# Patient Record
Sex: Female | Born: 1995 | Hispanic: No | Marital: Single | Smoking: Never smoker
Health system: Southern US, Community
[De-identification: ages and names within clinical notes are randomized; demographics above are authoritative.]

## PROBLEM LIST (undated history)

## (undated) ENCOUNTER — Inpatient Hospital Stay (HOSPITAL_COMMUNITY): Payer: Self-pay

## (undated) DIAGNOSIS — M25369 Other instability, unspecified knee: Secondary | ICD-10-CM

## (undated) DIAGNOSIS — I10 Essential (primary) hypertension: Secondary | ICD-10-CM

## (undated) HISTORY — PX: OTHER SURGICAL HISTORY: SHX169

---
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
## 2015-03-16 ENCOUNTER — Emergency Department (HOSPITAL_COMMUNITY)
Admission: EM | Admit: 2015-03-16 | Discharge: 2015-03-16 | Disposition: A | Discharge status: Home / Self Care | Payer: Medicaid Other | Attending: Emergency Medicine | Admitting: Emergency Medicine

## 2015-03-16 ENCOUNTER — Encounter (HOSPITAL_COMMUNITY): Payer: Self-pay | Admitting: Emergency Medicine

## 2015-03-16 DIAGNOSIS — R197 Diarrhea, unspecified: Secondary | ICD-10-CM | POA: Insufficient documentation

## 2015-03-16 DIAGNOSIS — R1084 Generalized abdominal pain: Secondary | ICD-10-CM | POA: Insufficient documentation

## 2015-03-16 DIAGNOSIS — Z3202 Encounter for pregnancy test, result negative: Secondary | ICD-10-CM | POA: Insufficient documentation

## 2015-03-16 DIAGNOSIS — J111 Influenza due to unidentified influenza virus with other respiratory manifestations: Secondary | ICD-10-CM | POA: Insufficient documentation

## 2015-03-16 DIAGNOSIS — I10 Essential (primary) hypertension: Secondary | ICD-10-CM | POA: Diagnosis not present

## 2015-03-16 DIAGNOSIS — R000 Tachycardia, unspecified: Secondary | ICD-10-CM | POA: Diagnosis not present

## 2015-03-16 DIAGNOSIS — R112 Nausea with vomiting, unspecified: Secondary | ICD-10-CM | POA: Diagnosis present

## 2015-03-16 DIAGNOSIS — J45909 Unspecified asthma, uncomplicated: Secondary | ICD-10-CM | POA: Insufficient documentation

## 2015-03-16 DIAGNOSIS — Z79899 Other long term (current) drug therapy: Secondary | ICD-10-CM | POA: Diagnosis not present

## 2015-03-16 LAB — COMPREHENSIVE METABOLIC PANEL
ALK PHOS: 82 U/L (ref 38–126)
ALT: 13 U/L — ABNORMAL LOW (ref 14–54)
ANION GAP: 12 (ref 5–15)
AST: 17 U/L (ref 15–41)
Albumin: 4.6 g/dL (ref 3.5–5.0)
BUN: 13 mg/dL (ref 6–20)
CALCIUM: 10 mg/dL (ref 8.9–10.3)
CO2: 19 mmol/L — AB (ref 22–32)
Chloride: 107 mmol/L (ref 101–111)
Creatinine, Ser: 0.87 mg/dL (ref 0.44–1.00)
GFR calc Af Amer: >60 mL/min (ref >60)
GFR calc non Af Amer: >60 mL/min (ref >60)
Glucose, Bld: 128 mg/dL — ABNORMAL HIGH (ref 65–99)
Potassium: 4.2 mmol/L (ref 3.5–5.1)
SODIUM: 138 mmol/L (ref 135–145)
Total Bilirubin: 0.7 mg/dL (ref 0.3–1.2)
Total Protein: 7.3 g/dL (ref 6.5–8.1)

## 2015-03-16 LAB — CBC
HCT: 40.6 % (ref 36.0–46.0)
Hemoglobin: 13.5 g/dL (ref 12.0–15.0)
MCH: 28 pg (ref 26.0–34.0)
MCHC: 33.3 g/dL (ref 30.0–36.0)
MCV: 84.2 fL (ref 78.0–100.0)
PLATELETS: 270 10*3/uL (ref 150–400)
RBC: 4.82 MIL/uL (ref 3.87–5.11)
RDW: 12.8 % (ref 11.5–15.5)
WBC: 14.6 10*3/uL — ABNORMAL HIGH (ref 4.0–10.5)

## 2015-03-16 LAB — LIPASE, BLOOD: Lipase: 25 U/L (ref 11–51)

## 2015-03-16 LAB — URINE MICROSCOPIC-ADD ON

## 2015-03-16 LAB — URINALYSIS, ROUTINE W REFLEX MICROSCOPIC
Bilirubin Urine: NEGATIVE
Glucose, UA: NEGATIVE mg/dL
HGB URINE DIPSTICK: NEGATIVE
Ketones, ur: 40 mg/dL — AB
Leukocytes, UA: NEGATIVE
NITRITE: NEGATIVE
PROTEIN: 30 mg/dL — AB
SPECIFIC GRAVITY, URINE: 1.024 (ref 1.005–1.030)
pH: 8.5 — ABNORMAL HIGH (ref 5.0–8.0)

## 2015-03-16 LAB — POC URINE PREG, ED: PREG TEST UR: NEGATIVE

## 2015-03-16 MED ORDER — MORPHINE SULFATE (PF) 2 MG/ML IV SOLN
2.0000 mg | Freq: Once | INTRAVENOUS | Status: AC
  Administered 2015-03-16: 2 mg via INTRAVENOUS
  Filled 2015-03-16: qty 1

## 2015-03-16 MED ORDER — ONDANSETRON 4 MG PO TBDP
ORAL_TABLET | ORAL | Status: AC
  Filled 2015-03-16: qty 1

## 2015-03-16 MED ORDER — SODIUM CHLORIDE 0.9 % IV BOLUS (SEPSIS)
1000.0000 mL | Freq: Once | INTRAVENOUS | Status: AC
  Administered 2015-03-16: 1000 mL via INTRAVENOUS

## 2015-03-16 MED ORDER — ONDANSETRON HCL 4 MG PO TABS: 4.0000 mg | ORAL_TABLET | Freq: Four times a day (QID) | ORAL | Status: DC

## 2015-03-16 MED ORDER — ONDANSETRON HCL 4 MG/2ML IJ SOLN
4.0000 mg | Freq: Once | INTRAMUSCULAR | Status: AC
  Administered 2015-03-16: 4 mg via INTRAVENOUS
  Filled 2015-03-16: qty 2

## 2015-03-16 NOTE — ED Notes (Signed)
Per ems-- pt has had cough x 4 days and tonight started having abdominal pain with nvd.

## 2015-03-16 NOTE — ED Notes (Signed)
 4MG  ZOFRAN ADMINISTERED IN TRIAGE

## 2015-03-16 NOTE — Discharge Instructions (Signed)
Diarrhea °Diarrhea is frequent loose and watery bowel movements. It can cause you to feel weak and dehydrated. Dehydration can cause you to become tired and thirsty, have a dry mouth, and have decreased urination that often is dark yellow. Diarrhea is a sign of another problem, most often an infection that will not last long. In most cases, diarrhea typically lasts 2-3 days. However, it can last longer if it is a sign of something more serious. It is important to treat your diarrhea as directed by your caregiver to lessen or prevent future episodes of diarrhea. °CAUSES  °Some common causes include: °· Gastrointestinal infections caused by viruses, bacteria, or parasites. °· Food poisoning or food allergies. °· Certain medicines, such as antibiotics, chemotherapy, and laxatives. °· Artificial sweeteners and fructose. °· Digestive disorders. °HOME CARE INSTRUCTIONS °· Ensure adequate fluid intake (hydration): Have 1 cup (8 oz) of fluid for each diarrhea episode. Avoid fluids that contain simple sugars or sports drinks, fruit juices, whole milk products, and sodas. Your urine should be clear or pale yellow if you are drinking enough fluids. Hydrate with an oral rehydration solution that you can purchase at pharmacies, retail stores, and online. You can prepare an oral rehydration solution at home by mixing the following ingredients together: °·  - tsp table salt. °· ¾ tsp baking soda. °·  tsp salt substitute containing potassium chloride. °· 1  tablespoons sugar. °· 1 L (34 oz) of water. °· Certain foods and beverages may increase the speed at which food moves through the gastrointestinal (GI) tract. These foods and beverages should be avoided and include: °· Caffeinated and alcoholic beverages. °· High-fiber foods, such as raw fruits and vegetables, nuts, seeds, and whole grain breads and cereals. °· Foods and beverages sweetened with sugar alcohols, such as xylitol, sorbitol, and mannitol. °· Some foods may be well  tolerated and may help thicken stool including: °· Starchy foods, such as rice, toast, pasta, low-sugar cereal, oatmeal, grits, baked potatoes, crackers, and bagels. °· Bananas. °· Applesauce. °· Add probiotic-rich foods to help increase healthy bacteria in the GI tract, such as yogurt and fermented milk products. °· Wash your hands well after each diarrhea episode. °· Only take over-the-counter or prescription medicines as directed by your caregiver. °· Take a warm bath to relieve any burning or pain from frequent diarrhea episodes. °SEEK IMMEDIATE MEDICAL CARE IF:  °· You are unable to keep fluids down. °· You have persistent vomiting. °· You have blood in your stool, or your stools are black and tarry. °· You do not urinate in 6-8 hours, or there is only a small amount of very dark urine. °· You have abdominal pain that increases or localizes. °· You have weakness, dizziness, confusion, or light-headedness. °· You have a severe headache. °· Your diarrhea gets worse or does not get better. °· You have a fever or persistent symptoms for more than 2-3 days. °· You have a fever and your symptoms suddenly get worse. °MAKE SURE YOU:  °· Understand these instructions. °· Will watch your condition. °· Will get help right away if you are not doing well or get worse. °  °This information is not intended to replace advice given to you by your health care provider. Make sure you discuss any questions you have with your health care provider. °  °Document Released: 12/13/2001 Document Revised: 01/13/2014 Document Reviewed: 08/31/2011 °Elsevier Interactive Patient Education ©2016 Elsevier Inc. ° °Nausea and Vomiting °Nausea is a sick feeling that often comes   before throwing up (vomiting). Vomiting is a reflex where stomach contents come out of your mouth. Vomiting can cause severe loss of body fluids (dehydration). Children and elderly adults can become dehydrated quickly, especially if they also have diarrhea. Nausea and  vomiting are symptoms of a condition or disease. It is important to find the cause of your symptoms. CAUSES   Direct irritation of the stomach lining. This irritation can result from increased acid production (gastroesophageal reflux disease), infection, food poisoning, taking certain medicines (such as nonsteroidal anti-inflammatory drugs), alcohol use, or tobacco use.  Signals from the brain.These signals could be caused by a headache, heat exposure, an inner ear disturbance, increased pressure in the brain from injury, infection, a tumor, or a concussion, pain, emotional stimulus, or metabolic problems.  An obstruction in the gastrointestinal tract (bowel obstruction).  Illnesses such as diabetes, hepatitis, gallbladder problems, appendicitis, kidney problems, cancer, sepsis, atypical symptoms of a heart attack, or eating disorders.  Medical treatments such as chemotherapy and radiation.  Receiving medicine that makes you sleep (general anesthetic) during surgery. DIAGNOSIS Your caregiver may ask for tests to be done if the problems do not improve after a few days. Tests may also be done if symptoms are severe or if the reason for the nausea and vomiting is not clear. Tests may include:  Urine tests.  Blood tests.  Stool tests.  Cultures (to look for evidence of infection).  X-rays or other imaging studies. Test results can help your caregiver make decisions about treatment or the need for additional tests. TREATMENT You need to stay well hydrated. Drink frequently but in small amounts.You may wish to drink water, sports drinks, clear broth, or eat frozen ice pops or gelatin dessert to help stay hydrated.When you eat, eating slowly may help prevent nausea.There are also some antinausea medicines that may help prevent nausea. HOME CARE INSTRUCTIONS   Take all medicine as directed by your caregiver.  If you do not have an appetite, do not force yourself to eat. However, you must  continue to drink fluids.  If you have an appetite, eat a normal diet unless your caregiver tells you differently.  Eat a variety of complex carbohydrates (rice, wheat, potatoes, bread), lean meats, yogurt, fruits, and vegetables.  Avoid high-fat foods because they are more difficult to digest.  Drink enough water and fluids to keep your urine clear or pale yellow.  If you are dehydrated, ask your caregiver for specific rehydration instructions. Signs of dehydration may include:  Severe thirst.  Dry lips and mouth.  Dizziness.  Dark urine.  Decreasing urine frequency and amount.  Confusion.  Rapid breathing or pulse. SEEK IMMEDIATE MEDICAL CARE IF:   You have blood or brown flecks (like coffee grounds) in your vomit.  You have black or bloody stools.  You have a severe headache or stiff neck.  You are confused.  You have severe abdominal pain.  You have chest pain or trouble breathing.  You do not urinate at least once every 8 hours.  You develop cold or clammy skin.  You continue to vomit for longer than 24 to 48 hours.  You have a fever. MAKE SURE YOU:   Understand these instructions.  Will watch your condition.  Will get help right away if you are not doing well or get worse.   This information is not intended to replace advice given to you by your health care provider. Make sure you discuss any questions you have with your health care  provider.   Document Released: 12/23/2004 Document Revised: 03/17/2011 Document Reviewed: 05/22/2010 Elsevier Interactive Patient Education 2016 Elsevier Inc.  Influenza, Adult Influenza ("the flu") is a viral infection of the respiratory tract. It occurs more often in winter months because people spend more time in close contact with one another. Influenza can make you feel very sick. Influenza easily spreads from person to person (contagious). CAUSES  Influenza is caused by a virus that infects the respiratory tract.  You can catch the virus by breathing in droplets from an infected person's cough or sneeze. You can also catch the virus by touching something that was recently contaminated with the virus and then touching your mouth, nose, or eyes. RISKS AND COMPLICATIONS You may be at risk for a more severe case of influenza if you smoke cigarettes, have diabetes, have chronic heart disease (such as heart failure) or lung disease (such as asthma), or if you have a weakened immune system. Elderly people and pregnant women are also at risk for more serious infections. The most common problem of influenza is a lung infection (pneumonia). Sometimes, this problem can require emergency medical care and may be life threatening. SIGNS AND SYMPTOMS  Symptoms typically last 4 to 10 days and may include:  Fever.  Chills.  Headache, body aches, and muscle aches.  Sore throat.  Chest discomfort and cough.  Poor appetite.  Weakness or feeling tired.  Dizziness.  Nausea or vomiting. DIAGNOSIS  Diagnosis of influenza is often made based on your history and a physical exam. A nose or throat swab test can be done to confirm the diagnosis. TREATMENT  In mild cases, influenza goes away on its own. Treatment is directed at relieving symptoms. For more severe cases, your health care provider may prescribe antiviral medicines to shorten the sickness. Antibiotic medicines are not effective because the infection is caused by a virus, not by bacteria. HOME CARE INSTRUCTIONS  Take medicines only as directed by your health care provider.  Use a cool mist humidifier to make breathing easier.  Get plenty of rest until your temperature returns to normal. This usually takes 3 to 4 days.  Drink enough fluid to keep your urine clear or pale yellow.  Cover yourmouth and nosewhen coughing or sneezing,and wash your handswellto prevent thevirusfrom spreading.  Stay homefromwork orschool untilthe fever is gonefor at  least 771full day. PREVENTION  An annual influenza vaccination (flu shot) is the best way to avoid getting influenza. An annual flu shot is now routinely recommended for all adults in the [place] SEEK MEDICAL CARE IF:  You experiencechest pain, yourcough worsens,or you producemore mucus.  Youhave nausea,vomiting, ordiarrhea.  Your fever returns or gets worse. SEEK IMMEDIATE MEDICAL CARE IF:  You havetrouble breathing, you become short of breath,or your skin ornails becomebluish.  You have severe painor stiffnessin the neck.  You develop a sudden headache, or pain in the face or ear.  You have nausea or vomiting that you cannot control. MAKE SURE YOU:   Understand these instructions.  Will watch your condition.  Will get help right away if you are not doing well or get worse.   This information is not intended to replace advice given to you by your health care provider. Make sure you discuss any questions you have with your health care provider.   Document Released: 12/21/1999 Document Revised: 01/13/2014 Document Reviewed: 03/24/2011 Elsevier Interactive Patient Education [employer]2016 Else[employer] Inc.

## 2015-03-16 NOTE — ED Provider Notes (Signed)
CSN: [id number]     Arrival date & time 03/16/15  0003 History  By signing my name below, I, Bethel Born, attest that this documentation has been prepared under the direction and in the presence of Gilda Crease, MD. Electronically Signed: Bethel Born, ED Scribe. 03/16/2015. 4:01 AM   Chief Complaint  Patient presents with  . Abdominal Pain    The history is provided by the patient. No language interpreter was used.   Brought in by EMS, Norma Morris is a 20 y.o. female who presents to the Emergency Department complaining of a persistent, moderate,  dry cough with onset 4 days ago. Associated symptoms include N/V/D since yesterday afternoon, cramping abdominal pain, and back pain. Pt denies fever.   Past Medical History  Diagnosis Date  . Knee gives out   . Asthma   . Hypertension     no medication currently   History reviewed. No pertinent past surgical history. Family History  Problem Relation Age of Onset  . Asthma Mother    Social History  Substance Use Topics  . Smoking status: Never Smoker   . Smokeless tobacco: Never Used  . Alcohol Use: No   OB History    No data available     Review of Systems  Constitutional: Negative for fever.  Respiratory: Positive for cough.   Gastrointestinal: Positive for nausea, vomiting, abdominal pain and diarrhea.  Musculoskeletal: Positive for back pain.  All other systems reviewed and are negative.     Allergies  Review of patient's allergies indicates no known allergies.  Home Medications   Prior to Admission medications   Medication Sig Start Date End Date Taking? Authorizing Provider  albuterol (PROVENTIL HFA;VENTOLIN HFA) 108 (90 BASE) MCG/ACT inhaler Inhale 2 puffs into the lungs every 4 (four) hours as needed for wheezing or shortness of breath.     Historical Provider, MD  budesonide-formoterol (SYMBICORT) 160-4.5 MCG/ACT inhaler Inhale 2 puffs into the lungs 2 (two) times daily.    Historical  Provider, MD  Cholecalciferol (VITAMIN D3) 2000 UNITS TABS Take 1 tablet by mouth daily.    Historical Provider, MD  ibuprofen (ADVIL,MOTRIN) 200 MG tablet Take 600 mg by mouth every 6 (six) hours as needed for pain.    Historical Provider, MD   BP 144/91 mmHg  Pulse 128  Temp(Src) 98.3 F (36.8 C) (Oral)  Resp 16  Ht 5' (1.524 m)  Wt 142 lb (64.411 kg)  BMI 27.73 kg/m2  SpO2 98%  LMP 02/28/2015 Physical Exam  Constitutional: She is oriented to person, place, and time. She appears well-developed and well-nourished. No distress.  HENT:  Head: Normocephalic and atraumatic.  Right Ear: Hearing normal.  Left Ear: Hearing normal.  Nose: Nose normal.  Mouth/Throat: Oropharynx is clear and moist. Mucous membranes are dry.  Dry mucous membranes  Eyes: Conjunctivae and EOM are normal. Pupils are equal, round, and reactive to light.  Neck: Normal range of motion. Neck supple.  Cardiovascular: Regular rhythm, S1 normal and S2 normal.  Tachycardia present.  Exam reveals no gallop and no friction rub.   No murmur heard. Pulmonary/Chest: Effort normal and breath sounds normal. No respiratory distress. She exhibits no tenderness.  Abdominal: Soft. Normal appearance and bowel sounds are normal. There is no hepatosplenomegaly. There is tenderness. There is no rebound, no guarding, no tenderness at McBurney's point and negative Murphy's sign. No hernia.  Diffuse abdominal tenderness with no guarding or rebound  Musculoskeletal: Normal range of motion.  Neurological: She  is alert and oriented to person, place, and time. She has normal strength. No cranial nerve deficit or sensory deficit. Coordination normal. GCS eye subscore is 4. GCS verbal subscore is 5. GCS motor subscore is 6.  Skin: Skin is warm, dry and intact. No rash noted. No cyanosis.  Psychiatric: She has a normal mood and affect. Her speech is normal and behavior is normal. Thought content normal.  Nursing note and vitals  reviewed.   ED Course  Procedures (including critical care time) DIAGNOSTIC STUDIES: Oxygen Saturation is 98% on RA,  normal by my interpretation.    COORDINATION OF CARE: 4:00 AM Discussed treatment plan which includes lab work, morphine, Zofran, and IVF with pt at bedside and pt agreed to plan.  Labs Review Labs Reviewed  COMPREHENSIVE METABOLIC PANEL - Abnormal; Notable for the following:    CO2 19 (*)    Glucose, Bld 128 (*)    ALT 13 (*)    All other components within normal limits  CBC - Abnormal; Notable for the following:    WBC 14.6 (*)    All other components within normal limits  URINALYSIS, ROUTINE W REFLEX MICROSCOPIC (NOT [facility]) - Abnormal; Notable for the following:    pH 8.5 (*)    Ketones, ur 40 (*)    Protein, ur 30 (*)    All other components within normal limits  URINE MICROSCOPIC-ADD ON - Abnormal; Notable for the following:    Squamous Epithelial / LPF 0-5 (*)    Bacteria, UA RARE (*)    All other components within normal limits  LIPASE, BLOOD  POC URINE PREG, ED    Imaging Review No results found. I have personally reviewed and evaluated these lab results as part of my medical decision-making.   EKG Interpretation None      MDM   Final diagnoses:  None  Vomiting Diarrhea Flu  Patient presents to the emergency part for evaluation of flulike illness. Patient reports that she has had cough and chest congestion for 4 days. Tonight she started having acute onset of abdominal pain with nausea, vomiting and diarrhea. Examination revealed mild diffuse tenderness without guarding or rebound. No concern for acute surgical process. Symptoms have improved with treatment. Lab work is unremarkable. Patient will continue outpatient symptomatic treatment  I personally performed the services described in this documentation, which was scribed in my presence. The recorded information has been reviewed and is accurate.    Gilda Creasehristopher J Pollina,  MD 03/16/15 0630

## 2015-06-16 ENCOUNTER — Encounter (HOSPITAL_COMMUNITY): Payer: Self-pay | Admitting: *Deleted

## 2015-06-16 ENCOUNTER — Ambulatory Visit (HOSPITAL_COMMUNITY)
Admission: EM | Admit: 2015-06-16 | Discharge: 2015-06-16 | Disposition: A | Discharge status: Home / Self Care | Payer: Medicaid Other | Attending: Emergency Medicine | Admitting: Emergency Medicine

## 2015-06-16 DIAGNOSIS — M62830 Muscle spasm of back: Secondary | ICD-10-CM | POA: Diagnosis not present

## 2015-06-16 MED ORDER — KETOROLAC TROMETHAMINE 30 MG/ML IJ SOLN
INTRAMUSCULAR | Status: AC
  Filled 2015-06-16: qty 1

## 2015-06-16 MED ORDER — METHYLPREDNISOLONE ACETATE 40 MG/ML IJ SUSP
INTRAMUSCULAR | Status: AC
  Filled 2015-06-16: qty 1

## 2015-06-16 MED ORDER — CYCLOBENZAPRINE HCL 10 MG PO TABS: 10.0000 mg | ORAL_TABLET | Freq: Two times a day (BID) | ORAL | Status: DC | PRN

## 2015-06-16 MED ORDER — KETOROLAC TROMETHAMINE 30 MG/ML IJ SOLN
30.0000 mg | Freq: Once | INTRAMUSCULAR | Status: AC
  Administered 2015-06-16: 30 mg via INTRAMUSCULAR

## 2015-06-16 MED ORDER — NAPROXEN SODIUM 550 MG PO TABS: 550.0000 mg | ORAL_TABLET | Freq: Two times a day (BID) | ORAL | Status: DC

## 2015-06-16 MED ORDER — METHYLPREDNISOLONE ACETATE 40 MG/ML IJ SUSP
40.0000 mg | Freq: Once | INTRAMUSCULAR | Status: AC
  Administered 2015-06-16: 40 mg via INTRAMUSCULAR

## 2015-06-16 NOTE — ED Provider Notes (Signed)
CSN: [id number]     Arrival date & time 06/16/15  1817 History   First MD Initiated Contact with Patient 06/16/15 1836     Chief Complaint  Patient presents with  . 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 Knee gives out   . Asthma   . 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 No pertinent past surgical history. Family History  Problem Relation Age of Onset  . Asthma Mother    Social History  Substance Use Topics  . Smoking status: Never Smoker   . Smokeless tobacco: Never Used  . Alcohol Use: No   OB History    No data available     Review of Systems  Denies: HEADACHE, NAUSEA, ABDOMINAL PAIN, CHEST PAIN, CONGESTION, DYSURIA, SHORTNESS OF BREATH  Allergies  Review of patient's allergies indicates no known allergies.  Home Medications   Prior to Admission medications   Medication Sig Start Date End Date Taking? Authorizing Provider  albuterol (PROVENTIL HFA;VENTOLIN HFA) 108 (90 BASE) MCG/ACT inhaler Inhale 2 puffs into the lungs every 4 (four) hours as needed for wheezing or shortness of breath.    Yes Historical Provider, MD  budesonide-formoterol (SYMBICORT) 160-4.5 MCG/ACT inhaler Inhale 2 puffs into the lungs 2 (two) times daily.   Yes Historical Provider, MD  cetirizine (ZYRTEC) 10 MG tablet Take 10 mg by mouth daily.   Yes Historical Provider, MD  Cholecalciferol (VITAMIN D3) 2000 UNITS TABS Take 1 tablet by mouth daily.    Historical Provider, MD  cyclobenzaprine (FLEXERIL) 10 MG tablet  Take 1 tablet (10 mg total) by mouth 2 (two) times daily as needed for muscle spasms. 06/16/15   Tharon Aquas, PA  naproxen sodium (ANAPROX DS) 550 MG tablet Take 1 tablet (550 mg total) by mouth 2 (two) times daily with a meal. 06/16/15   Tharon Aquas, PA  ondansetron (ZOFRAN) 4 MG tablet Take 1 tablet (4 mg total) by mouth every 6 (six) hours. 03/16/15   Gilda Crease, MD   Meds Ordered and Administered this Visit   Medications  ketorolac (TORADOL) 30 MG/ML injection 30 mg (not administered)  methylPREDNISolone acetate (DEPO-MEDROL) injection 40 mg (not administered)    BP 125/80 mmHg  Pulse 88  Temp(Src) 98.3 F (36.8 C) (Oral)  Resp 16  SpO2 100%  LMP 05/28/2015 (Exact Date) No data found.   Physical Exam NURSES NOTES AND VITAL SIGNS REVIEWED. CONSTITUTIONAL: Well developed, well nourished, no acute distress HEENT: normocephalic, atraumatic EYES: Conjunctiva normal NECK:normal ROM, supple, no adenopathy PULMONARY:No respiratory distress, normal effort ABDOMINAL: Soft, ND, NT BS+, No CVAT MUSCULOSKELETAL: Normal ROM of all extremities,palpable spasm noted left lumbar,SLR's neg  SKIN: warm and dry without rash PSYCHIATRIC: Mood and affect, behavior are normal  ED Course  Procedures (including critical care time)  Labs Review Labs Reviewed - No data to display  Imaging Review No results found.   Visual Acuity Review  Right Eye Distance:   Left Eye Distance:   Bilateral Distance:    Right Eye Near:   Left Eye Near:  Bilateral Near:       Flexeril, anaprox  Expect fully recovery>  MDM   1. Spasm of back muscles     Patient is reassured that there are no issues that require transfer to higher level of care at this time or additional tests. Patient is advised to continue home symptomatic treatment. Patient is advised that if there are new or worsening symptoms to attend the emergency department, contact primary care provider, or return to  [facility]. Instructions of care provided discharged home in stable condition.    THIS NOTE WAS GENERATED USING A VOICE RECOGNITION SOFTWARE PROGRAM. ALL REASONABLE EFFORTS  WERE MADE TO PROOFREAD THIS DOCUMENT FOR ACCURACY.  I have verbally reviewed the discharge instructions with the patient. A printed AVS was given to the patient.  All questions were answered prior to discharge.      Tharon AquasFrank C Tamyra Fojtik, PA 06/16/15 [phone]

## 2015-06-16 NOTE — Discharge Instructions (Signed)
Heat Therapy Heat therapy can help ease sore, stiff, injured, and tight muscles and joints. Heat relaxes your muscles, which may help ease your pain.  RISKS AND COMPLICATIONS If you have any of the following conditions, do not use heat therapy unless your health care provider has approved: 1. Poor circulation. 2. Healing wounds or scarred skin in the area being treated. 3. Diabetes, heart disease, or high blood pressure. 4. Not being able to feel (numbness) the area being treated. 5. Unusual swelling of the area being treated. 6. Active infections. 7. Blood clots. 8. Cancer. 9. Inability to communicate pain. This may include young children and people who have problems with their brain function (dementia). 10. Pregnancy. Heat therapy should only be used on old, pre-existing, or long-lasting (chronic) injuries. Do not use heat therapy on new injuries unless directed by your health care provider. HOW TO USE HEAT THERAPY There are several different kinds of heat therapy, including: 1. Moist heat pack. 2. Warm water bath. 3. Hot water bottle. 4. Electric heating pad. 5. Heated gel pack. 6. Heated wrap. 7. Electric heating pad. Use the heat therapy method suggested by your health care provider. Follow your health care provider's instructions on when and how to use heat therapy. GENERAL HEAT THERAPY RECOMMENDATIONS 1. Do not sleep while using heat therapy. Only use heat therapy while you are awake. 2. Your skin may turn pink while using heat therapy. Do not use heat therapy if your skin turns red. 3. Do not use heat therapy if you have new pain. 4. High heat or long exposure to heat can cause burns. Be careful when using heat therapy to avoid burning your skin. 5. Do not use heat therapy on areas of your skin that are already irritated, such as with a rash or sunburn. SEEK MEDICAL CARE IF: 1. You have blisters, redness, swelling, or numbness. 2. You have new pain. 3. Your pain is  worse. MAKE SURE YOU: 1. Understand these instructions. 2. Will watch your condition. 3. Will get help right away if you are not doing well or get worse.   This information is not intended to replace advice given to you by your health care provider. Make sure you discuss any questions you have with your health care provider.   Document Released: 03/17/2011 Document Revised: 01/13/2014 Document Reviewed: 02/15/2013 Elsevier Interactive Patient Education 2016 [employer]  Back Exercises If you have pain in your back, do these exercises 2-3 times each day or as told by your doctor. When the pain goes away, do the exercises once each day, but repeat the steps more times for each exercise (do more repetitions). If you do not have pain in your back, do these exercises once each day or as told by your doctor. EXERCISES Single Knee to Chest Do these steps 3-5 times in a row for each leg: 11. Lie on your back on a firm bed or the floor with your legs stretched out. 12. Bring one knee to your chest. 13. Hold your knee to your chest by grabbing your knee or thigh. 14. Pull on your knee until you feel a gentle stretch in your lower back. 15. Keep doing the stretch for 10-30 seconds. 16. Slowly let go of your leg and straighten it. Pelvic Tilt Do these steps 5-10 times in a row: 8. Lie on your back on a firm bed or the floor with your legs stretched out. 9. Bend your knees so they point up to the ceiling. Your  feet should be flat on the floor. 10. Tighten your lower belly (abdomen) muscles to press your lower back against the floor. This will make your tailbone point up to the ceiling instead of pointing down to your feet or the floor. 11. Stay in this position for 5-10 seconds while you gently tighten your muscles and breathe evenly. Cat-Cow Do these steps until your lower back bends more easily: 6. Get on your hands and knees on a firm surface. Keep your hands under your shoulders, and keep  your knees under your hips. You may put padding under your knees. 7. Let your head hang down, and make your tailbone point down to the floor so your lower back is round like the back of a cat. 8. Stay in this position for 5 seconds. 9. Slowly lift your head and make your tailbone point up to the ceiling so your back hangs low (sags) like the back of a cow. 10. Stay in this position for 5 seconds. Press-Ups Do these steps 5-10 times in a row: 4. Lie on your belly (face-down) on the floor. 5. Place your hands near your head, about shoulder-width apart. 6. While you keep your back relaxed and keep your hips on the floor, slowly straighten your arms to raise the top half of your body and lift your shoulders. Do not use your back muscles. To make yourself more comfortable, you may change where you place your hands. 7. Stay in this position for 5 seconds. 8. Slowly return to lying flat on the floor. Bridges Do these steps 10 times in a row: 4. Lie on your back on a firm surface. 5. Bend your knees so they point up to the ceiling. Your feet should be flat on the floor. 6. Tighten your butt muscles and lift your butt off of the floor until your waist is almost as high as your knees. If you do not feel the muscles working in your butt and the back of your thighs, slide your feet 1-2 inches farther away from your butt. 7. Stay in this position for 3-5 seconds. 8. Slowly lower your butt to the floor, and let your butt muscles relax. If this exercise is too easy, try doing it with your arms crossed over your chest. Belly Crunches Do these steps 5-10 times in a row: 1. Lie on your back on a firm bed or the floor with your legs stretched out. 2. Bend your knees so they point up to the ceiling. Your feet should be flat on the floor. 3. Cross your arms over your chest. 4. Tip your chin a little bit toward your chest but do not bend your neck. 5. Tighten your belly muscles and slowly raise your chest just  enough to lift your shoulder blades a tiny bit off of the floor. 6. Slowly lower your chest and your head to the floor. Back Lifts Do these steps 5-10 times in a row: 1. Lie on your belly (face-down) with your arms at your sides, and rest your forehead on the floor. 2. Tighten the muscles in your legs and your butt. 3. Slowly lift your chest off of the floor while you keep your hips on the floor. Keep the back of your head in line with the curve in your back. Look at the floor while you do this. 4. Stay in this position for 3-5 seconds. 5. Slowly lower your chest and your face to the floor. GET HELP IF:  Your back pain gets  a lot worse when you do an exercise.  Your back pain does not lessen 2 hours after you exercise. If you have any of these problems, stop doing the exercises. Do not do them again unless your doctor says it is okay. GET HELP RIGHT AWAY IF:  You have sudden, very bad back pain. If this happens, stop doing the exercises. Do not do them again unless your doctor says it is okay.   This information is not intended to replace advice given to you by your health care provider. Make sure you discuss any questions you have with your health care provider.   Document Released: 01/25/2010 Document Revised: 09/13/2014 Document Reviewed: 02/16/2014 Elsevier Interactive Patient Education [employer]

## 2015-06-16 NOTE — ED Notes (Signed)
Was trimming horse's hooves earlier in week when she felt sudden sharp pain in low back; was able to "walk it off" after 1-2 min, with complete resolution.  Throughout the week has started with gradual low back pain, now radiating into BLE - L>R without parasthesias.  Has tried stretching, but has taken no other measures to help alleviate pain.

## 2015-06-19 ENCOUNTER — Emergency Department (HOSPITAL_COMMUNITY)
Admission: EM | Admit: 2015-06-19 | Discharge: 2015-06-19 | Disposition: A | Discharge status: Home / Self Care | Payer: Medicaid Other | Attending: Emergency Medicine | Admitting: Emergency Medicine

## 2015-06-19 ENCOUNTER — Encounter (HOSPITAL_COMMUNITY): Payer: Self-pay

## 2015-06-19 DIAGNOSIS — T7840XA Allergy, unspecified, initial encounter: Secondary | ICD-10-CM | POA: Diagnosis present

## 2015-06-19 DIAGNOSIS — J45909 Unspecified asthma, uncomplicated: Secondary | ICD-10-CM | POA: Diagnosis not present

## 2015-06-19 DIAGNOSIS — Z79899 Other long term (current) drug therapy: Secondary | ICD-10-CM | POA: Diagnosis not present

## 2015-06-19 DIAGNOSIS — I10 Essential (primary) hypertension: Secondary | ICD-10-CM | POA: Diagnosis not present

## 2015-06-19 MED ORDER — FAMOTIDINE 20 MG PO TABS
40.0000 mg | ORAL_TABLET | Freq: Once | ORAL | Status: AC
  Administered 2015-06-19: 40 mg via ORAL
  Filled 2015-06-19: qty 2

## 2015-06-19 MED ORDER — PREDNISONE 20 MG PO TABS
60.0000 mg | ORAL_TABLET | Freq: Once | ORAL | Status: AC
  Administered 2015-06-19: 60 mg via ORAL
  Filled 2015-06-19: qty 3

## 2015-06-19 MED ORDER — PREDNISONE 20 MG PO TABS: 40.0000 mg | ORAL_TABLET | Freq: Every day | ORAL | Status: DC

## 2015-06-19 NOTE — ED Provider Notes (Signed)
CSN: [id number]     Arrival date & time 06/19/15  1140 History  By signing my name below, I, Marisue Humble, attest that this documentation has been prepared under the direction and in the presence of non-physician practitioner, Cheri Fowler, PA-C. Electronically Signed: Marisue Humble, Scribe. 06/19/2015. 12:42 PM.   Chief Complaint  Patient presents with  . Allergic Reaction    The history is provided by the patient. No language interpreter was used.   HPI Comments:  Norma Morris is a 20 y.o. female with PMHx of asthma who presents to the Emergency Department complaining of worsening, itchy, red rash spreading over neck and chest beginning last night. Pt reports associated mild throat swelling earlier today. Pt recently started taking Flexeril for pack pain; she took 3 doses with relief of back pain and stopped taking the medication yesterday. Pt took 2 Benadryl PTA with relief of throat swelling; she sprayed rash with Benadryl without relief. Denies using any new soaps, detergents or lotions, rash anywhere else on her body, difficulty breathing, chest pain, or shortness of breath.  Past Medical History  Diagnosis Date  . Knee gives out   . Asthma   . 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 No pertinent past surgical history. Family History  Problem Relation Age of Onset  . Asthma Mother    Social History  Substance Use Topics  . Smoking status: Never Smoker   . Smokeless tobacco: Never Used  . Alcohol Use: No   OB History    No data available     Review of Systems  HENT: Positive for facial swelling (throat).   Respiratory: Negative for shortness of breath.   Cardiovascular: Negative for chest pain.  Skin: Positive for rash.    Allergies  Review of patient's allergies indicates no known allergies.  Home Medications   Prior to Admission medications   Medication Sig Start Date End Date Taking?  Authorizing Provider  albuterol (PROVENTIL HFA;VENTOLIN HFA) 108 (90 BASE) MCG/ACT inhaler Inhale 2 puffs into the lungs every 4 (four) hours as needed for wheezing or shortness of breath.     Historical Provider, MD  budesonide-formoterol (SYMBICORT) 160-4.5 MCG/ACT inhaler Inhale 2 puffs into the lungs 2 (two) times daily.    Historical Provider, MD  cetirizine (ZYRTEC) 10 MG tablet Take 10 mg by mouth daily.    Historical Provider, MD  Cholecalciferol (VITAMIN D3) 2000 UNITS TABS Take 1 tablet by mouth daily.    Historical Provider, MD  cyclobenzaprine (FLEXERIL) 10 MG tablet Take 1 tablet (10 mg total) by mouth 2 (two) times daily as needed for muscle spasms. 06/16/15   Tharon Aquas, PA  naproxen sodium (ANAPROX DS) 550 MG tablet Take 1 tablet (550 mg total) by mouth 2 (two) times daily with a meal. 06/16/15   Tharon Aquas, PA  ondansetron (ZOFRAN) 4 MG tablet Take 1 tablet (4 mg total) by mouth every 6 (six) hours. 03/16/15   Gilda Crease, MD   BP 129/75 mmHg  Pulse 78  Temp(Src) 98.7 F (37.1 C) (Oral)  Resp 18  SpO2 100%  LMP 05/28/2015 (Exact Date)   Physical Exam  Constitutional: She is oriented to person, place, and time. She appears well-developed and well-nourished.  Non-toxic appearance. She does not have a sickly appearance. She does not appear ill.  HENT:  Head: Normocephalic and atraumatic.  Right Ear: External ear normal.  Left  Ear: External ear normal.  Mouth/Throat: Oropharynx is clear and moist.  Airway patent. Patient able to speak in clear full sentences without difficulty. No posterior oropharyngeal swelling. No wheezing or stridor.  Eyes: Conjunctivae are normal. No scleral icterus.  Neck: Normal range of motion. Neck supple. No tracheal deviation present.  Cardiovascular: Normal rate and regular rhythm.   Pulmonary/Chest: Effort normal and breath sounds normal. No accessory muscle usage or stridor. No respiratory distress. She has no wheezes. She has  no rhonchi. She has no rales.  Abdominal: Soft. Bowel sounds are normal. She exhibits no distension. There is no tenderness.  Musculoskeletal: Normal range of motion.  Lymphadenopathy:    She has no cervical adenopathy.  Neurological: She is alert and oriented to person, place, and time.  Speech clear without dysarthria.  Skin: Skin is warm and dry.  Erythematous maculopapular rash scattered over chest and anterior neck. Skin intact. No signs of infection.  Psychiatric: She has a normal mood and affect. Her behavior is normal.    ED Course  Procedures  DIAGNOSTIC STUDIES:  Oxygen Saturation is 100% on RA, normal by my interpretation.    COORDINATION OF CARE:  12:41 PM Will start on Zyrtec and short course of Prednisone. Discussed treatment plan with pt at bedside and pt agreed to plan.  Labs Review Labs Reviewed - No data to display  Imaging Review No results found. I have personally reviewed and evaluated these images and lab results as part of my medical decision-making.   EKG Interpretation None      MDM   Final diagnoses:  Allergic reaction, initial encounter   Patient presents with possible allergic reaction to Flexeril. VSS, NAD. Advised to stop taking and avoid Flexeril in the future. Patient given prednisone and Pepcid in ED. Discharge home with prednisone. Recommend Zantac and Zyrtec as well. Follow up PCP as needed. Discussed return precautions. Patient agrees and acknowledges the above plan for discharge.  I personally performed the services described in this documentation, which was scribed in my presence. The recorded information has been reviewed and is accurate.    Cheri FowlerKayla Dustina Scoggin, PA-C 06/19/15 1304  Pricilla LovelessScott Goldston, MD 06/22/15 [phone]

## 2015-06-19 NOTE — ED Notes (Signed)
Started Flexeril 3 days ago. Started breaking out in red, itching rash to chest last pm, worsening this am. Took 2 Benadryl this am.

## 2015-06-19 NOTE — Discharge Instructions (Signed)
Please stop taking and avoid Flexeril in the future. Take prednisone over the next 5 days. You may also takes Zyrtec daily and Zantac twice daily until your symptoms resolved. Return to the emergency department if you experience throat swelling, difficulty breathing, or chest pain.  Anaphylactic Reaction   An anaphylactic reaction is a sudden, severe allergic reaction that involves the whole body. It can be life threatening. A hospital stay is often required. People with asthma, eczema, or hay fever are slightly more likely to have an anaphylactic reaction.  CAUSES  An anaphylactic reaction may be caused by anything to which you are allergic. After being exposed to the allergic substance, your immune system becomes sensitized to it. When you are exposed to that allergic substance again, an allergic reaction can occur. Common causes of an anaphylactic reaction include:  Medicines.  Foods, especially peanuts, wheat, shellfish, milk, and eggs.  Insect bites or stings.  Blood products.  Chemicals, such as dyes, latex, and contrast material used for imaging tests. SYMPTOMS  When an allergic reaction occurs, the body releases histamine and other substances. These substances cause symptoms such as tightening of the airway. Symptoms often develop within seconds or minutes of exposure. Symptoms may include:  Skin rash or hives.  Itching.  Chest tightness.  Swelling of the eyes, tongue, or lips.  Trouble breathing or swallowing.  Lightheadedness or fainting.  Anxiety or confusion.  Stomach pains, vomiting, or diarrhea.  Nasal congestion.  A fast or irregular heartbeat (palpitations). DIAGNOSIS  Diagnosis is based on your history of recent exposure to allergic substances, your symptoms, and a physical exam. Your caregiver may also perform blood or urine tests to confirm the diagnosis.  TREATMENT  Epinephrine medicine is the main treatment for an anaphylactic reaction. Other medicines that may be used  for treatment include antihistamines, steroids, and albuterol. In severe cases, fluids and medicine to support blood pressure may be given through an intravenous line (IV). Even if you improve after treatment, you need to be observed to make sure your condition does not get worse. This may require a stay in the hospital.  [place] a medical alert bracelet or necklace stating your allergy.  You and your family must learn how to use an anaphylaxis kit or give an epinephrine injection to temporarily treat an emergency allergic reaction. Always carry your epinephrine injection or anaphylaxis kit with you. This can be lifesaving if you have a severe reaction.  Do not drive or perform tasks after treatment until the medicines used to treat your reaction have worn off, or until your caregiver says it is okay.  If you have hives or a rash:  Take medicines as directed by your caregiver.  You may use an over-the-counter antihistamine (diphenhydramine) as needed.  Apply cold compresses to the skin or take baths in cool water. Avoid hot baths or showers. SEEK MEDICAL CARE IF:  You develop symptoms of an allergic reaction to a new substance. Symptoms may start right away or minutes later.  You develop a rash, hives, or itching.  You develop new symptoms. SEEK IMMEDIATE MEDICAL CARE IF:  You have swelling of the mouth, difficulty breathing, or wheezing.  You have a tight feeling in your chest or throat.  You develop hives, swelling, or itching all over your body.  You develop severe vomiting or diarrhea.  You feel faint or pass out. This is an emergency. Use your epinephrine injection or anaphylaxis kit as you have been instructed.  Call your local emergency services (911 in [place] Even if you improve after the injection, you need to be examined at a hospital emergency department.  MAKE SURE YOU:  Understand these instructions.  Will watch your condition.  Will get help right away if you  are not doing well or get worse. This information is not intended to replace advice given to you by your health care provider. Make sure you discuss any questions you have with your health care provider.  Document Released: 12/23/2004 Document Revised: 12/28/2012 Document Reviewed: 07/05/2014  Elsevier Interactive Patient Education [employer].

## 2015-06-19 NOTE — ED Notes (Addendum)
Patient here to have rash checked to neck and chest. Thinks related to new prescription of flexeril. Stopped the medication today and took benadryl x 2, no distress

## 2015-08-28 ENCOUNTER — Encounter: Payer: Self-pay | Admitting: *Deleted

## 2015-09-24 ENCOUNTER — Encounter: Payer: Medicaid Other | Admitting: Obstetrics and Gynecology

## 2015-10-25 ENCOUNTER — Encounter: Payer: Self-pay | Admitting: Obstetrics and Gynecology

## 2015-10-25 ENCOUNTER — Encounter (INDEPENDENT_AMBULATORY_CARE_PROVIDER_SITE_OTHER): Payer: Medicaid Other | Admitting: Obstetrics and Gynecology

## 2015-10-25 VITALS — BP 130/62 | HR 61 | Ht 60.0 in | Wt 131.0 lb

## 2015-10-25 NOTE — Progress Notes (Signed)
GYN Note  Patient referred by PCP for pap smear, but records indicated she wanted a nexplanon, so appointment kept. Unfortunately, those were old records and patient had nexplanon placed in august of this year and she states that she believes she was sent for a pap smear. Negative PMHx and she denies any h/o HIV or immunocompromised disease. She has some AUB that is resolving and not an issue and has had recent STD testing (declines any today) and states she's had the HPV vaccine series. Patient hit nexplanon a while back and had a bruise and wants to know if it's okay. NTTP in the LUE and in a great place just under the subcu area. nexplanon intact and no e/o breakage.   Given this, I told her to call us in feb or march of next year for a pap smear after her 21st birthday and that the AUB should continue to get better with the nexplanon in place for longer. Will refund her co pay and cancel her visit. Apologized to patient for the inconvenience.   Pt amenable to plan.  Norma Morris, Jr MD Attending [facility](Faculty Practice)

## 2015-11-02 NOTE — Progress Notes (Signed)
See prior note

## 2015-11-13 ENCOUNTER — Ambulatory Visit (HOSPITAL_COMMUNITY)
Admission: EM | Admit: 2015-11-13 | Discharge: 2015-11-13 | Disposition: A | Discharge status: Home / Self Care | Payer: Medicaid Other | Attending: Family Medicine | Admitting: Family Medicine

## 2015-11-13 ENCOUNTER — Encounter (HOSPITAL_COMMUNITY): Payer: Self-pay | Admitting: Emergency Medicine

## 2015-11-13 ENCOUNTER — Ambulatory Visit (INDEPENDENT_AMBULATORY_CARE_PROVIDER_SITE_OTHER): Payer: Medicaid Other

## 2015-11-13 DIAGNOSIS — B349 Viral infection, unspecified: Secondary | ICD-10-CM | POA: Diagnosis not present

## 2015-11-13 DIAGNOSIS — J9801 Acute bronchospasm: Secondary | ICD-10-CM | POA: Diagnosis not present

## 2015-11-13 MED ORDER — IPRATROPIUM-ALBUTEROL 0.5-2.5 (3) MG/3ML IN SOLN
3.0000 mL | Freq: Once | RESPIRATORY_TRACT | Status: AC
  Administered 2015-11-13: 3 mL via RESPIRATORY_TRACT

## 2015-11-13 MED ORDER — PREDNISONE 50 MG PO TABS: ORAL_TABLET | ORAL | 0 refills | Status: DC

## 2015-11-13 MED ORDER — IPRATROPIUM-ALBUTEROL 0.5-2.5 (3) MG/3ML IN SOLN
RESPIRATORY_TRACT | Status: AC
  Filled 2015-11-13: qty 3

## 2015-11-13 NOTE — Discharge Instructions (Signed)
Use her albuterol inhaler 2 puffs every 4 hours as needed for call spasms and wheezing. Take the prednisone daily as directed to help with airway inflammation. Sudafed 10 mg every 4 hours as needed for congestion Allegra or Zyrtec daily as needed for drainage and runny nose. For stronger antihistamine may take Chlor-Trimeton 2 to 4 mg every 4 to 6 hours, may cause drowsiness. Saline nasal spray used frequently. Ibuprofen 600 mg every 6 hours as needed for pain, discomfort or fever. Drink plenty of fluids and stay well-hydrated. Flonase or Rhinocort nasal spray daily

## 2015-11-13 NOTE — ED Provider Notes (Signed)
CSN: [id number]     Arrival date & time 11/13/15  1704 History   First MD Initiated Contact with Patient 11/13/15 1854     Chief Complaint  Patient presents with  . Generalized Body Aches   (Consider location/radiation/quality/duration/timing/severity/associated sxs/prior Treatment) 20 year old female with history of asthma states she has had a cough with coughing spasms, headache, earaches and diaphragm soreness over the past 4-5 days. She has used her albuterol HFA with little to no relief and cough or breathing. She thinks she had a fever 2 or 3 days ago but did not measure it. Also with nasal congestion and rhinorrhea      Past Medical History:  Diagnosis Date  . Asthma   . 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 Knee gives out    History reviewed. No pertinent surgical history. Family History  Problem Relation Age of Onset  . Asthma Mother   . Heart disease Maternal Grandmother    Social History  Substance Use Topics  . Smoking status: Never Smoker  . Smokeless tobacco: Never Used  . Alcohol use No   OB History    No data available     Review of Systems  Constitutional: Positive for fever. Negative for activity change, appetite change, chills and fatigue.  HENT: Positive for congestion, postnasal drip and rhinorrhea. Negative for facial swelling and sore throat.   Eyes: Negative.   Respiratory: Positive for cough and shortness of breath.   Cardiovascular: Negative.   Musculoskeletal: Negative for neck pain and neck stiffness.  Skin: Negative for pallor and rash.  Neurological: Negative.   All other systems reviewed and are negative.   Allergies  Flexeril [cyclobenzaprine]  Home Medications   Prior to Admission medications   Medication Sig Start Date End Date Taking? Authorizing Provider  albuterol (PROVENTIL HFA;VENTOLIN HFA) 108 (90 BASE) MCG/ACT inhaler Inhale 2 puffs into the lungs every 4 (four) hours  as needed for wheezing or shortness of breath.    Yes Historical Provider, MD  budesonide-formoterol (SYMBICORT) 160-4.5 MCG/ACT inhaler Inhale 2 puffs into the lungs 2 (two) times daily.   Yes Historical Provider, MD  cetirizine (ZYRTEC) 10 MG tablet Take 10 mg by mouth daily.   Yes Historical Provider, MD  Cholecalciferol (VITAMIN D3) 2000 UNITS TABS Take 1 tablet by mouth daily.   Yes Historical Provider, MD  etonogestrel (NEXPLANON) 68 MG IMPL implant 1 each by Subdermal route once.   Yes Historical Provider, MD  naproxen sodium (ANAPROX DS) 550 MG tablet Take 1 tablet (550 mg total) by mouth 2 (two) times daily with a meal. 06/16/15   Tharon AquasFrank C Patrick, PA  predniSONE (DELTASONE) 50 MG tablet 1 tab po daily for 6 days. 11/13/15   Hayden Rasmussenavid Evelyna Folker, NP   Meds Ordered and Administered this Visit   Medications  ipratropium-albuterol (DUONEB) 0.5-2.5 (3) MG/3ML nebulizer solution 3 mL (3 mLs Nebulization Given 11/13/15 1917)    BP 138/78 (BP Location: Left Arm)   Pulse 84   Temp 98.5 F (36.9 C) (Oral)   Resp 18   SpO2 99%  No data found.   Physical Exam  Constitutional: She is oriented to person, place, and time. She appears well-developed and well-nourished. No distress.  HENT:  Head: Normocephalic and atraumatic.  Right Ear: External ear normal.  Left Ear: External ear normal.  Oropharynx with moderate amount of clear PND. Mild injection. No exudates.  Eyes: EOM are normal. Pupils are  equal, round, and reactive to light.  Neck: Normal range of motion. Neck supple.  Cardiovascular: Normal rate, regular rhythm and normal heart sounds.   Pulmonary/Chest: Effort normal and breath sounds normal. No respiratory distress.  Respirations are primarily clear. Forced expiration reveals minimally prolonged expiratory phase. Patient has episodes of coughing spasms.  Musculoskeletal: Normal range of motion. She exhibits no edema.  Lymphadenopathy:    She has no cervical adenopathy.  Neurological: She  is alert and oriented to person, place, and time.  Skin: Skin is warm and dry.  Psychiatric: She has a normal mood and affect.  Nursing note and vitals reviewed.   Urgent Care Course   Clinical Course     Procedures (including critical care time)  Labs Review Labs Reviewed - No data to display  Imaging Review Dg Chest 2 View  Result Date: 11/13/2015 CLINICAL DATA:  20 y/o  F; 6 days of cough. EXAM: CHEST  2 VIEW COMPARISON:  10/30/2012 chest radiograph FINDINGS: The heart size and mediastinal contours are within normal limits and stable. Both lungs are clear. Minimal dextro curvature upper thoracic spine is stable. IMPRESSION: No active cardiopulmonary disease. Electronically Signed   By: Mitzi HansenLance  Furusawa-Stratton M.D.   On: 11/13/2015 19:38     Visual Acuity Review  Right Eye Distance:   Left Eye Distance:   Bilateral Distance:    Right Eye Near:   Left Eye Near:    Bilateral Near:         MDM   1. Viral syndrome   2. Bronchospasm   Improved post duoneb. Use her albuterol inhaler 2 puffs every 4 hours as needed for call spasms and wheezing. Take the prednisone daily as directed to help with airway inflammation. Sudafed 10 mg every 4 hours as needed for congestion Allegra or Zyrtec daily as needed for drainage and runny nose. For stronger antihistamine may take Chlor-Trimeton 2 to 4 mg every 4 to 6 hours, may cause drowsiness. Saline nasal spray used frequently. Ibuprofen 600 mg every 6 hours as needed for pain, discomfort or fever. Drink plenty of fluids and stay well-hydrated. Flonase or Rhinocort nasal spray daily Meds ordered this encounter  Medications  . ipratropium-albuterol (DUONEB) 0.5-2.5 (3) MG/3ML nebulizer solution 3 mL  . predniSONE (DELTASONE) 50 MG tablet    Sig: 1 tab po daily for 6 days.    Dispense:  6 tablet    Refill:  0    Order Specific Question:   Supervising Provider    Answer:   Linna HoffKINDL, JAMES D [[id number]]       Hayden Rasmussenavid Dalinda Heidt,  NP 11/13/15 1946

## 2015-11-13 NOTE — ED Triage Notes (Signed)
[facility] a [facility] of general body aches x 1 week. The patient reported that she has had a cough earlier in the week and now has rib pain and abdominal pain from coughing. The patient also reported a headache and ear pain.

## 2015-12-24 ENCOUNTER — Encounter (HOSPITAL_COMMUNITY): Payer: Self-pay | Admitting: Emergency Medicine

## 2015-12-24 ENCOUNTER — Emergency Department (HOSPITAL_COMMUNITY)
Admission: EM | Admit: 2015-12-24 | Discharge: 2015-12-25 | Disposition: A | Discharge status: Home / Self Care | Payer: Medicaid Other | Attending: Emergency Medicine | Admitting: Emergency Medicine

## 2015-12-24 DIAGNOSIS — N938 Other specified abnormal uterine and vaginal bleeding: Secondary | ICD-10-CM | POA: Diagnosis not present

## 2015-12-24 DIAGNOSIS — R102 Pelvic and perineal pain unspecified side: Secondary | ICD-10-CM

## 2015-12-24 DIAGNOSIS — N83202 Unspecified ovarian cyst, left side: Secondary | ICD-10-CM

## 2015-12-24 DIAGNOSIS — J45909 Unspecified asthma, uncomplicated: Secondary | ICD-10-CM | POA: Diagnosis not present

## 2015-12-24 DIAGNOSIS — I10 Essential (primary) hypertension: Secondary | ICD-10-CM | POA: Diagnosis not present

## 2015-12-24 DIAGNOSIS — N939 Abnormal uterine and vaginal bleeding, unspecified: Secondary | ICD-10-CM | POA: Diagnosis present

## 2015-12-24 MED ORDER — KETOROLAC TROMETHAMINE 30 MG/ML IJ SOLN
30.0000 mg | Freq: Once | INTRAMUSCULAR | Status: AC
  Administered 2015-12-25: 30 mg via INTRAMUSCULAR
  Filled 2015-12-24: qty 1

## 2015-12-24 NOTE — ED Provider Notes (Signed)
[facility]-EMERGENCY DEPT Provider Note   CSN: [id number] Arrival date & time: 12/24/15  2006  By signing my name below, I, Clovis PuAvnee Patel, attest that this documentation has been prepared under the direction and in the presence of Shon Batonourtney F Horton, MD  Electronically Signed: Clovis PuAvnee Patel, ED Scribe. 12/24/15. 11:44 PM.   History   Chief Complaint Chief Complaint  Patient presents with  . Vaginal Bleeding   The history is provided by the patient. No language interpreter was used.   HPI Comments:  Norma Morris is a 20 y.o. female who presents to the Emergency Department complaining of sudden onset, persistent vaginal bleeding x 3 days. She also reports associated intermittent, sharp, "7-8/10" abdominal pain which moves from one side of her abdomen to the other. Pt currently notes pain to the right side of her abdomen. She states her current bleeding is not similar to bleeding associated to her menstrual cycle and notes she is going through more pads and is experiencing more abdominal pain than usual. No alleviating factors noted. Pt denies vaginal discharge, concerns for STDs, dysuria, hematuria, any other urinary symptoms, fevers, any major medical problems, any other associated symptoms and modifying factors at this time. Pt states she has an implant x 4 months.  Past Medical History:  Diagnosis Date  . Asthma   . 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 Knee gives out     Patient Active Problem List   Diagnosis Date Noted  . Left knee pain 03/11/2011    History reviewed. No pertinent surgical history.  OB History    No data available       Home Medications    Prior to Admission medications   Medication Sig Start Date End Date Taking? Authorizing Provider  albuterol (PROVENTIL HFA;VENTOLIN HFA) 108 (90 BASE) MCG/ACT inhaler Inhale 2 puffs into the lungs every 4 (four) hours as needed for wheezing or shortness of breath.      Historical Provider, MD  budesonide-formoterol (SYMBICORT) 160-4.5 MCG/ACT inhaler Inhale 2 puffs into the lungs 2 (two) times daily.    Historical Provider, MD  cetirizine (ZYRTEC) 10 MG tablet Take 10 mg by mouth daily.    Historical Provider, MD  Cholecalciferol (VITAMIN D3) 2000 UNITS TABS Take 1 tablet by mouth daily.    Historical Provider, MD  etonogestrel (NEXPLANON) 68 MG IMPL implant 1 each by Subdermal route once.    Historical Provider, MD  HYDROcodone-acetaminophen (NORCO/VICODIN) 5-325 MG tablet Take 1 tablet by mouth every 6 (six) hours as needed. 12/25/15   Shon Batonourtney F Horton, MD  naproxen (NAPROSYN) 500 MG tablet Take 1 tablet (500 mg total) by mouth 2 (two) times daily. 12/25/15   Shon Batonourtney F Horton, MD  naproxen sodium (ANAPROX DS) 550 MG tablet Take 1 tablet (550 mg total) by mouth 2 (two) times daily with a meal. 06/16/15   Tharon AquasFrank C Patrick, PA  predniSONE (DELTASONE) 50 MG tablet 1 tab po daily for 6 days. 11/13/15   Hayden Rasmussenavid Mabe, NP    Family History Family History  Problem Relation Age of Onset  . Asthma Mother   . Heart disease Maternal Grandmother     Social History Social History  Substance Use Topics  . Smoking status: Never Smoker  . Smokeless tobacco: Never Used  . Alcohol use No     Allergies   Flexeril [cyclobenzaprine]   Review of Systems Review of Systems  Constitutional: Negative for fever.  Gastrointestinal: Positive for abdominal pain. Negative for diarrhea, nausea and vomiting.  Genitourinary: Positive for vaginal bleeding. Negative for dysuria, hematuria and vaginal discharge.  All other systems reviewed and are negative.  Physical Exam Updated Vital Signs BP 116/72   Pulse 74   Temp 98.4 F (36.9 C) (Oral)   Resp 12   Ht 5' (1.524 m)   Wt 134 lb (60.8 kg)   SpO2 98%   BMI 26.17 kg/m   Physical Exam  Constitutional: She is oriented to person, place, and time. She appears well-developed and well-nourished. No distress.  HENT:    Head: Normocephalic and atraumatic.  Cardiovascular: Normal rate, regular rhythm and normal heart sounds.   No murmur heard. Pulmonary/Chest: Effort normal and breath sounds normal. No respiratory distress. She has no wheezes.  Abdominal: Soft. Bowel sounds are normal. There is no tenderness. There is no guarding.  Diffuse lower abdominal tenderness to palpation, no rebound or guarding  Genitourinary:  Genitourinary Comments: Moderate blood noted in the vaginal vault, no bleeding noted from the os, left-sided adnexal tenderness without mass  Neurological: She is alert and oriented to person, place, and time.  Skin: Skin is warm and dry.  Psychiatric: She has a normal mood and affect.  Nursing note and vitals reviewed.    ED Treatments / Results  DIAGNOSTIC STUDIES:  Oxygen Saturation is 100% on RA, normal by my interpretation.    COORDINATION OF CARE:  11:42 PM Discussed treatment plan with pt at bedside and pt agreed to plan.  Labs (all labs ordered are listed, but only abnormal results are displayed) Labs Reviewed  WET PREP, GENITAL - Abnormal; Notable for the following:       Result Value   Clue Cells Wet Prep HPF POC PRESENT (*)    WBC, Wet Prep HPF POC MANY (*)    All other components within normal limits  URINALYSIS, ROUTINE W REFLEX MICROSCOPIC - Abnormal; Notable for the following:    APPearance HAZY (*)    Hgb urine dipstick LARGE (*)    Squamous Epithelial / LPF 0-5 (*)    All other components within normal limits  PREGNANCY, URINE  HEMOGLOBIN AND HEMATOCRIT, BLOOD  GC/CHLAMYDIA PROBE AMP ([place]) NOT AT [facility]    EKG  EKG Interpretation None       Radiology US Transvaginal Non-ob  Result Date: 12/25/2015 CLINICAL DATA:  20 year old female with pelvic pain and vaginal bleeding. EXAM: TRANSABDOMINAL AND TRANSVAGINAL ULTRASOUND OF PELVIS DOPPLER ULTRASOUND OF OVARIES TECHNIQUE: Both transabdominal and transvaginal ultrasound examinations of the pelvis  were performed. Transabdominal technique was performed for global imaging of the pelvis including uterus, ovaries, adnexal regions, and pelvic cul-de-sac. It was necessary to proceed with endovaginal exam following the transabdominal exam to visualize the endometrium and the ovaries. Color and duplex Doppler ultrasound was utilized to evaluate blood flow to the ovaries. COMPARISON:  None. FINDINGS: Uterus Measurements: The uterus is anteverted and measures 6.7 x 3.3 x 4.5 cm. No fibroids or other mass visualized. Endometrium Thickness: 3.46 mm.  No focal abnormality visualized. Right ovary Measurements: 3.5 x 1.9 x 1.8 cm. Normal appearance/no adnexal mass. Left ovary Measurements: 3.5 x 2.6 x 3.2 cm. There is a 2.4 x 1.1 x 2.0 cm dominant follicle/cyst in the left ovary. Pulsed Doppler evaluation of both ovaries demonstrates normal low-resistance arterial and venous waveforms. Other findings Trace free fluid within the pelvis. IMPRESSION: A 2.4 cm left ovarian cyst otherwise unremarkable pelvic ultrasound. Doppler detected flow to both  ovaries. Electronically Signed   By: Elgie Collard M.D.   On: 12/25/2015 03:02   US Pelvis Complete  Result Date: 12/25/2015 CLINICAL DATA:  20 year old female with pelvic pain and vaginal bleeding. EXAM: TRANSABDOMINAL AND TRANSVAGINAL ULTRASOUND OF PELVIS DOPPLER ULTRASOUND OF OVARIES TECHNIQUE: Both transabdominal and transvaginal ultrasound examinations of the pelvis were performed. Transabdominal technique was performed for global imaging of the pelvis including uterus, ovaries, adnexal regions, and pelvic cul-de-sac. It was necessary to proceed with endovaginal exam following the transabdominal exam to visualize the endometrium and the ovaries. Color and duplex Doppler ultrasound was utilized to evaluate blood flow to the ovaries. COMPARISON:  None. FINDINGS: Uterus Measurements: The uterus is anteverted and measures 6.7 x 3.3 x 4.5 cm. No fibroids or other mass  visualized. Endometrium Thickness: 3.46 mm.  No focal abnormality visualized. Right ovary Measurements: 3.5 x 1.9 x 1.8 cm. Normal appearance/no adnexal mass. Left ovary Measurements: 3.5 x 2.6 x 3.2 cm. There is a 2.4 x 1.1 x 2.0 cm dominant follicle/cyst in the left ovary. Pulsed Doppler evaluation of both ovaries demonstrates normal low-resistance arterial and venous waveforms. Other findings Trace free fluid within the pelvis. IMPRESSION: A 2.4 cm left ovarian cyst otherwise unremarkable pelvic ultrasound. Doppler detected flow to both ovaries. Electronically Signed   By: Elgie Collard M.D.   On: 12/25/2015 03:02   [place] Art/ven Flow Abd Pelv Doppler  Result Date: 12/25/2015 CLINICAL DATA:  20 year old female with pelvic pain and vaginal bleeding. EXAM: TRANSABDOMINAL AND TRANSVAGINAL ULTRASOUND OF PELVIS DOPPLER ULTRASOUND OF OVARIES TECHNIQUE: Both transabdominal and transvaginal ultrasound examinations of the pelvis were performed. Transabdominal technique was performed for global imaging of the pelvis including uterus, ovaries, adnexal regions, and pelvic cul-de-sac. It was necessary to proceed with endovaginal exam following the transabdominal exam to visualize the endometrium and the ovaries. Color and duplex Doppler ultrasound was utilized to evaluate blood flow to the ovaries. COMPARISON:  None. FINDINGS: Uterus Measurements: The uterus is anteverted and measures 6.7 x 3.3 x 4.5 cm. No fibroids or other mass visualized. Endometrium Thickness: 3.46 mm.  No focal abnormality visualized. Right ovary Measurements: 3.5 x 1.9 x 1.8 cm. Normal appearance/no adnexal mass. Left ovary Measurements: 3.5 x 2.6 x 3.2 cm. There is a 2.4 x 1.1 x 2.0 cm dominant follicle/cyst in the left ovary. Pulsed Doppler evaluation of both ovaries demonstrates normal low-resistance arterial and venous waveforms. Other findings Trace free fluid within the pelvis. IMPRESSION: A 2.4 cm left ovarian cyst otherwise unremarkable  pelvic ultrasound. Doppler detected flow to both ovaries. Electronically Signed   By: Elgie Collard M.D.   On: 12/25/2015 03:02    Procedures Procedures (including critical care time)  Medications Ordered in ED Medications  ketorolac (TORADOL) 30 MG/ML injection 30 mg (30 mg Intramuscular Given 12/25/15 0045)     Initial Impression / Assessment and Plan / ED Course  I have reviewed the triage vital signs and the nursing notes.  Pertinent labs & imaging results that were available during my care of the patient were reviewed by me and considered in my medical decision making (see chart for details).  Clinical Course     She presents with vaginal bleeding. Nontoxic. Denies dizziness or signs of anemia. Hemoglobin reassuring. No massive bleeding noted on pelvic exam. She is tender on the left. Will obtain ultrasound to rule out torsion. Ultrasound with left ovarian cyst. No evidence of torsion. Patient feels much better after Toradol. Will discharge home with naproxen and short course  of Norco. [facility]  After history, exam, and medical workup I feel the patient has been appropriately medically screened and is safe for discharge home. Pertinent diagnoses were discussed with the patient. Patient was given return precautions.   Final Clinical Impressions(s) / ED Diagnoses   Final diagnoses:  Pelvic pain  Dysfunctional uterine bleeding  Cyst of left ovary    New Prescriptions New Prescriptions   HYDROCODONE-ACETAMINOPHEN (NORCO/VICODIN) 5-325 MG TABLET    Take 1 tablet by mouth every 6 (six) hours as needed.   NAPROXEN (NAPROSYN) 500 MG TABLET    Take 1 tablet (500 mg total) by mouth 2 (two) times daily.  I personally performed the services described in this documentation, which was scribed in my presence. The recorded information has been reviewed and is accurate.     Shon Batonourtney F Horton, MD 12/25/15 [phone]

## 2015-12-24 NOTE — ED Triage Notes (Signed)
Pt st's she has had vaginal  Bleeding x's 3 days.  St's she has implant x's 3-4 months and usually does not bleed

## 2015-12-25 ENCOUNTER — Emergency Department (HOSPITAL_COMMUNITY): Payer: Medicaid Other

## 2015-12-25 ENCOUNTER — Telehealth: Payer: Self-pay | Admitting: *Deleted

## 2015-12-25 LAB — URINALYSIS, ROUTINE W REFLEX MICROSCOPIC
Bacteria, UA: NONE SEEN
Bilirubin Urine: NEGATIVE
GLUCOSE, UA: NEGATIVE mg/dL
Ketones, ur: NEGATIVE mg/dL
Leukocytes, UA: NEGATIVE
NITRITE: NEGATIVE
PH: 7 (ref 5.0–8.0)
Protein, ur: NEGATIVE mg/dL
SPECIFIC GRAVITY, URINE: 1.028 (ref 1.005–1.030)

## 2015-12-25 LAB — WET PREP, GENITAL
Sperm: NONE SEEN
Trich, Wet Prep: NONE SEEN
Yeast Wet Prep HPF POC: NONE SEEN

## 2015-12-25 LAB — HEMOGLOBIN AND HEMATOCRIT, BLOOD
HCT: 37.9 % (ref 36.0–46.0)
Hemoglobin: 12.5 g/dL (ref 12.0–15.0)

## 2015-12-25 LAB — GC/CHLAMYDIA PROBE AMP (~~LOC~~) NOT AT ARMC
CHLAMYDIA, DNA PROBE: NEGATIVE
NEISSERIA GONORRHEA: NEGATIVE

## 2015-12-25 LAB — PREGNANCY, URINE: PREG TEST UR: NEGATIVE

## 2015-12-25 MED ORDER — HYDROCODONE-ACETAMINOPHEN 5-325 MG PO TABS: 1.0000 | ORAL_TABLET | Freq: Four times a day (QID) | ORAL | 0 refills | Status: DC | PRN

## 2015-12-25 MED ORDER — NAPROXEN 500 MG PO TABS: 500.0000 mg | ORAL_TABLET | Freq: Two times a day (BID) | ORAL | 0 refills | Status: DC

## 2015-12-25 NOTE — ED Notes (Signed)
Ultrasound needs pt to have a full bladder for scan. Pt given cup of ice water, ginger ale to drink. Also have caffeinated drink at bedside.

## 2015-12-25 NOTE — Discharge Instructions (Signed)
You were seen today for pelvic pain and dysfunctional uterine bleeding. This may be related to your implant. You do have a left-sided ovarian cyst which is likely related to the pain. Follow-up with women's outpatient clinic if symptoms persist. See bleeding precautions above.

## 2015-12-25 NOTE — Telephone Encounter (Signed)
A female left  A voicemail yesterday afternoon stating she was last seen 9/18 and requests we call [facility].

## 2015-12-25 NOTE — ED Notes (Signed)
Patient transported to Ultrasound 

## 2015-12-26 ENCOUNTER — Encounter: Payer: Self-pay | Admitting: Obstetrics and Gynecology

## 2015-12-26 MED ORDER — METRONIDAZOLE 500 MG PO TABS: 500.0000 mg | ORAL_TABLET | Freq: Two times a day (BID) | ORAL | 0 refills | Status: DC

## 2015-12-26 NOTE — Telephone Encounter (Signed)
LM for pt to return call.  MyChart message sent to pt notifying her of wet prep results and that tx has been sent.

## 2015-12-29 ENCOUNTER — Encounter: Payer: Self-pay | Admitting: Obstetrics and Gynecology

## 2016-11-20 DIAGNOSIS — J45909 Unspecified asthma, uncomplicated: Secondary | ICD-10-CM | POA: Insufficient documentation

## 2016-12-02 ENCOUNTER — Encounter (HOSPITAL_COMMUNITY): Payer: Self-pay | Admitting: Emergency Medicine

## 2016-12-02 ENCOUNTER — Emergency Department (HOSPITAL_COMMUNITY): Payer: Self-pay

## 2016-12-02 ENCOUNTER — Other Ambulatory Visit: Payer: Self-pay

## 2016-12-02 ENCOUNTER — Emergency Department (HOSPITAL_COMMUNITY)
Admission: EM | Admit: 2016-12-02 | Discharge: 2016-12-03 | Disposition: A | Discharge status: Home / Self Care | Payer: Self-pay | Attending: Emergency Medicine | Admitting: Emergency Medicine

## 2016-12-02 DIAGNOSIS — R0989 Other specified symptoms and signs involving the circulatory and respiratory systems: Secondary | ICD-10-CM | POA: Insufficient documentation

## 2016-12-02 DIAGNOSIS — J4521 Mild intermittent asthma with (acute) exacerbation: Secondary | ICD-10-CM | POA: Insufficient documentation

## 2016-12-02 DIAGNOSIS — R51 Headache: Secondary | ICD-10-CM | POA: Insufficient documentation

## 2016-12-02 DIAGNOSIS — R0602 Shortness of breath: Secondary | ICD-10-CM | POA: Insufficient documentation

## 2016-12-02 DIAGNOSIS — R000 Tachycardia, unspecified: Secondary | ICD-10-CM | POA: Insufficient documentation

## 2016-12-02 DIAGNOSIS — J029 Acute pharyngitis, unspecified: Secondary | ICD-10-CM | POA: Insufficient documentation

## 2016-12-02 DIAGNOSIS — I10 Essential (primary) hypertension: Secondary | ICD-10-CM | POA: Insufficient documentation

## 2016-12-02 DIAGNOSIS — Z79899 Other long term (current) drug therapy: Secondary | ICD-10-CM | POA: Insufficient documentation

## 2016-12-02 LAB — COMPREHENSIVE METABOLIC PANEL
ALBUMIN: 4.3 g/dL (ref 3.5–5.0)
ALT: 21 U/L (ref 14–54)
ANION GAP: 10 (ref 5–15)
AST: 27 U/L (ref 15–41)
Alkaline Phosphatase: 91 U/L (ref 38–126)
BILIRUBIN TOTAL: 0.8 mg/dL (ref 0.3–1.2)
BUN: 10 mg/dL (ref 6–20)
CO2: 22 mmol/L (ref 22–32)
Calcium: 9.7 mg/dL (ref 8.9–10.3)
Chloride: 107 mmol/L (ref 101–111)
Creatinine, Ser: 0.87 mg/dL (ref 0.44–1.00)
GFR calc Af Amer: >60 mL/min (ref >60)
Glucose, Bld: 99 mg/dL (ref 65–99)
Potassium: 3.4 mmol/L — ABNORMAL LOW (ref 3.5–5.1)
Sodium: 139 mmol/L (ref 135–145)
TOTAL PROTEIN: 7.3 g/dL (ref 6.5–8.1)

## 2016-12-02 LAB — CBC WITH DIFFERENTIAL/PLATELET
Basophils Absolute: 0 10*3/uL (ref 0.0–0.1)
Basophils Relative: 0 %
Eosinophils Absolute: 0.2 10*3/uL (ref 0.0–0.7)
Eosinophils Relative: 1 %
HEMATOCRIT: 39.5 % (ref 36.0–46.0)
Hemoglobin: 13.4 g/dL (ref 12.0–15.0)
LYMPHS PCT: 21 %
Lymphs Abs: 2.1 10*3/uL (ref 0.7–4.0)
MCH: 28.5 pg (ref 26.0–34.0)
MCHC: 33.9 g/dL (ref 30.0–36.0)
MCV: 83.9 fL (ref 78.0–100.0)
Monocytes Absolute: 0.5 10*3/uL (ref 0.1–1.0)
Monocytes Relative: 5 %
NEUTROS ABS: 7.6 10*3/uL (ref 1.7–7.7)
Neutrophils Relative %: 73 %
PLATELETS: 255 10*3/uL (ref 150–400)
RBC: 4.71 MIL/uL (ref 3.87–5.11)
RDW: 12.6 % (ref 11.5–15.5)
WBC: 10.4 10*3/uL (ref 4.0–10.5)

## 2016-12-02 LAB — I-STAT CG4 LACTIC ACID, ED: Lactic Acid, Venous: 2.07 mmol/L (ref 0.5–1.9)

## 2016-12-02 LAB — I-STAT BETA HCG BLOOD, ED (MC, WL, AP ONLY): I-stat hCG, quantitative: <5 m[IU]/mL (ref <5)

## 2016-12-02 MED ORDER — ALBUTEROL SULFATE (2.5 MG/3ML) 0.083% IN NEBU
5.0000 mg | INHALATION_SOLUTION | Freq: Once | RESPIRATORY_TRACT | Status: AC
  Administered 2016-12-02: 5 mg via RESPIRATORY_TRACT

## 2016-12-02 MED ORDER — ALBUTEROL (5 MG/ML) CONTINUOUS INHALATION SOLN
INHALATION_SOLUTION | RESPIRATORY_TRACT | Status: DC
Start: 2016-12-02 — End: 2016-12-03
  Filled 2016-12-02: qty 1

## 2016-12-02 NOTE — ED Notes (Signed)
Writer notified EDP of abnormal I-stat lactic result 

## 2016-12-02 NOTE — ED Triage Notes (Signed)
Patient arrives with complaint of cough, sore throat, and asthma exacerbation for >1 week. Tachycardic and tachypneic in triage.

## 2016-12-03 LAB — D-DIMER, QUANTITATIVE (NOT AT ARMC): D DIMER QUANT: 0.28 ug{FEU}/mL (ref 0.00–0.50)

## 2016-12-03 MED ORDER — ACETAMINOPHEN 500 MG PO TABS
1000.0000 mg | ORAL_TABLET | Freq: Once | ORAL | Status: AC
  Administered 2016-12-03: 1000 mg via ORAL
  Filled 2016-12-03: qty 2

## 2016-12-03 MED ORDER — ALBUTEROL SULFATE HFA 108 (90 BASE) MCG/ACT IN AERS
2.0000 | INHALATION_SPRAY | Freq: Once | RESPIRATORY_TRACT | Status: AC
  Administered 2016-12-03: 2 via RESPIRATORY_TRACT
  Filled 2016-12-03: qty 6.7

## 2016-12-03 MED ORDER — PREDNISONE 20 MG PO TABS
60.0000 mg | ORAL_TABLET | Freq: Every day | ORAL | 0 refills | Status: DC
Start: 2016-12-03 — End: 2017-08-03

## 2016-12-03 MED ORDER — METHYLPREDNISOLONE SODIUM SUCC 125 MG IJ SOLR
125.0000 mg | Freq: Once | INTRAMUSCULAR | Status: AC
  Administered 2016-12-03: 125 mg via INTRAVENOUS
  Filled 2016-12-03: qty 2

## 2016-12-03 MED ORDER — BENZONATATE 100 MG PO CAPS: 100.0000 mg | ORAL_CAPSULE | Freq: Three times a day (TID) | ORAL | 0 refills | Status: DC | PRN

## 2016-12-03 MED ORDER — BENZONATATE 100 MG PO CAPS
100.0000 mg | ORAL_CAPSULE | Freq: Once | ORAL | Status: AC
  Administered 2016-12-03: 100 mg via ORAL
  Filled 2016-12-03: qty 1

## 2016-12-03 MED ORDER — SODIUM CHLORIDE 0.9 % IV BOLUS (SEPSIS)
1000.0000 mL | Freq: Once | INTRAVENOUS | Status: AC
  Administered 2016-12-03: 1000 mL via INTRAVENOUS

## 2016-12-03 NOTE — ED Provider Notes (Signed)
TIME SEEN: 12:06 AM  CHIEF COMPLAINT: "I have been sick for 2 weeks"  HPI: Patient is a 21 year old female with history of asthma, previous hypertension not on medication who presents to the emergency department with complaints of intermittent productive cough and runny nose for the past 2 weeks.  States she is coughing up clear sputum.  No fevers, vomiting or diarrhea.  Has had some sore throat that she describes as a tightness.  States that she was having an asthma exacerbation when she arrived and states that she is feeling better after albuterol in the waiting room.  She states she is having a mild headache currently.  No neck pain or neck stiffness.  She does report over the past couple of weeks she feels very winded with exertion.  States that this is not normal for her.  Denies history of PE or DVT.  ROS: See HPI Constitutional: no fever  Eyes: no drainage  ENT: no runny nose   Cardiovascular:  no chest pain  Resp:  SOB  GI: no vomiting GU: no dysuria Integumentary: no rash  Allergy: no hives  Musculoskeletal: no leg swelling  Neurological: no slurred speech ROS otherwise negative  PAST MEDICAL HISTORY/PAST SURGICAL HISTORY:  Past Medical History:  Diagnosis Date  . Asthma   . 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 Knee gives out     MEDICATIONS:  Prior to Admission medications   Medication Sig Start Date End Date Taking? Authorizing Provider  albuterol (PROVENTIL HFA;VENTOLIN HFA) 108 (90 BASE) MCG/ACT inhaler Inhale 2 puffs into the lungs every 4 (four) hours as needed for wheezing or shortness of breath.     [provider]  budesonide-formoterol (SYMBICORT) 160-4.5 MCG/ACT inhaler Inhale 2 puffs into the lungs 2 (two) times daily.    [provider]  cetirizine (ZYRTEC) 10 MG tablet Take 10 mg by mouth daily as needed for allergies.     [provider]  Cholecalciferol (VITAMIN D3) 2000 UNITS  TABS Take 1 tablet by mouth daily.    [provider]  etonogestrel (NEXPLANON) 68 MG IMPL implant 1 each by Subdermal route once.    [provider]  HYDROcodone-acetaminophen (NORCO/VICODIN) 5-325 MG tablet Take 1 tablet by mouth every 6 (six) hours as needed. 12/25/15   Horton, Mayer Masker, MD  metroNIDAZOLE (FLAGYL) 500 MG tablet Take 1 tablet (500 mg total) by mouth 2 (two) times daily. 12/26/15   Hermina Staggers, MD  naproxen (NAPROSYN) 500 MG tablet Take 1 tablet (500 mg total) by mouth 2 (two) times daily. 12/25/15   Horton, Mayer Masker, MD  naproxen sodium (ANAPROX DS) 550 MG tablet Take 1 tablet (550 mg total) by mouth 2 (two) times daily with a meal. 06/16/15   Tharon Aquas, PA  predniSONE (DELTASONE) 50 MG tablet 1 tab po daily for 6 days. Patient not taking: Reported on 12/25/2015 11/13/15   Hayden Rasmussen, NP    ALLERGIES:  Allergies  Allergen Reactions  . Asa [Aspirin] Nausea Only  . Flexeril [Cyclobenzaprine] Rash    SOCIAL HISTORY:  Social History   Tobacco Use  . Smoking status: Never Smoker  . Smokeless tobacco: Never Used  Substance Use Topics  . Alcohol use: No    FAMILY HISTORY: Family History  Problem Relation Age of Onset  . Asthma Mother   . Heart disease Maternal Grandmother     EXAM: BP 138/90   Pulse Marland Kitchen)  125   Temp 98.2 F (36.8 C) (Oral)   Resp 19   SpO2 99%  CONSTITUTIONAL: Alert and oriented and responds appropriately to questions. Well-appearing; well-nourished HEAD: Normocephalic EYES: Conjunctivae clear, pupils appear equal, EOMI ENT: normal nose; moist mucous membranes; No pharyngeal erythema or petechiae, no tonsillar hypertrophy or exudate, no uvular deviation, no unilateral swelling, no trismus or drooling, no muffled voice, normal phonation, no stridor, no dental caries present, no drainable dental abscess noted, no Ludwig's angina, tongue sits flat in the bottom of the mouth, no angioedema, no facial erythema or  warmth, no facial swelling; no pain with movement of the neck. NECK: Supple, no meningismus, no nuchal rigidity, no LAD  CARD: Regular and tachycardic; S1 and S2 appreciated; no murmurs, no clicks, no rubs, no gallops RESP: Normal chest excursion without splinting or tachypnea; breath sounds clear and equal bilaterally; no wheezes, no rhonchi, no rales, no hypoxia or respiratory distress, speaking full sentences ABD/GI: Normal bowel sounds; non-distended; soft, non-tender, no rebound, no guarding, no peritoneal signs, no hepatosplenomegaly BACK:  The back appears normal and is non-tender to palpation, there is no CVA tenderness EXT: Normal ROM in all joints; non-tender to palpation; no edema; normal capillary refill; no cyanosis, no calf tenderness or swelling    SKIN: Normal color for age and race; warm; no rash NEURO: Moves all extremities equally PSYCH: The patient's mood and manner are appropriate. Grooming and personal hygiene are appropriate.  MEDICAL DECISION MAKING: Patient here with cough and runny nose for the past couple of weeks.  Also feels short of breath.  She is tachycardic here but it is unclear if this was before or after albuterol given.  She has no heart rate documented before albuterol.  Still 3 hours after receiving albuterol she is still very tachycardic.  Her lungs however are clear.  She is smiling, laughing, afebrile and appears nontoxic.  Labs unremarkable.  She is not pregnant.  Chest x-ray is clear.  No respiratory distress at this time.  She has good aeration and no hypoxia.  Will obtain d-dimer as I feel she is low risk for PE.  Will give IV fluids, IV steroids.  Will give her Tylenol for her headache.  I do not think clinically that she has pneumonia.  No sign of no sign of meningitis.  No sign of pharyngitis, deep space neck infection, PTA.  Does have a slightly elevated lactate likely from her asthma exacerbation and mild dehydration.  Will hydrate patient.  Doubt sepsis.   I do not feel this needs to be repeated.  ED PROGRESS: Patient's d-dimer is negative.  Heart rate is slowly improving.   2:20 AM Pt reports feeling much better.  Heart rate has improved.  Lungs are still clear.  D-dimer was negative.  Doubt PE.  Will discharge home with albuterol inhaler prescriptions for prednisone for the next 5 days as well as Tessalon Perles.  Recommend increase fluid intake.  Discussed return precautions.  Patient and family comfortable with this plan.  Will give outpatient PCP follow-up information.   At this time, I do not feel there is any life-threatening condition present. I have reviewed and discussed all results (EKG, imaging, lab, urine as appropriate) and exam findings with patient/family. I have reviewed nursing notes and appropriate previous records.  I feel the patient is safe to be discharged home without further emergent workup and can continue workup as an outpatient as needed. Discussed usual and customary return precautions. Patient/family verbalize understanding  and are comfortable with this plan.  Outpatient follow-up has been provided if needed. All questions have been answered.      EKG Interpretation  Date/Time:  Wednesday December 03 2016 01:06:31 EST Ventricular Rate:  114 PR Interval:    QRS Duration: 79 QT Interval:  319 QTC Calculation: 440 R Axis:   83 Text Interpretation:  Sinus tachycardia Borderline repolarization abnormality Confirmed by Rochele RaringWard, Theressa Piedra [phone]([id number]) on 12/03/2016 1:10:49 AM         Severus Brodzinski, Layla MawKristen N, DO 12/03/16 0221

## 2016-12-03 NOTE — Discharge Instructions (Signed)
You may alternate Tylenol 1000 mg every 6 hours as needed for pain and Ibuprofen 800 mg every 8 hours as needed for pain.  Please take Ibuprofen with food.   You may use your albuterol inhaler 2-4 puffs every 2-4 hours as needed for shortness of breath and wheezing.  To find a primary care or specialty doctor please call [phone] or [phone] to access "[facility] Find a Doctor Service."  You may also go on the [facility] website at [email or web address]  There are also multiple [facility], [facility], [facility] and [facility] practices throughout the [place] that are frequently accepting new patients. You may find a clinic that is close to your home and contact them.  [facility] -  [address] [place] [phone]   [facility] -  [address] [place] [id number] [phone]   [facility] [phone]  [place] [phone]

## 2016-12-03 NOTE — ED Notes (Signed)
Pt requesting to speak with MD due to finding out insurance cut her. Pt does not want any treatment until she speaks with the MD

## 2017-08-03 ENCOUNTER — Encounter (HOSPITAL_COMMUNITY): Payer: Self-pay | Admitting: Emergency Medicine

## 2017-08-03 ENCOUNTER — Ambulatory Visit (HOSPITAL_COMMUNITY)
Admission: EM | Admit: 2017-08-03 | Discharge: 2017-08-03 | Disposition: A | Discharge status: Home / Self Care | Payer: Medicaid Other | Attending: Family Medicine | Admitting: Family Medicine

## 2017-08-03 DIAGNOSIS — B9789 Other viral agents as the cause of diseases classified elsewhere: Secondary | ICD-10-CM

## 2017-08-03 DIAGNOSIS — J069 Acute upper respiratory infection, unspecified: Secondary | ICD-10-CM

## 2017-08-03 DIAGNOSIS — R05 Cough: Secondary | ICD-10-CM

## 2017-08-03 MED ORDER — ALBUTEROL SULFATE HFA 108 (90 BASE) MCG/ACT IN AERS
2.0000 | INHALATION_SPRAY | Freq: Once | RESPIRATORY_TRACT | Status: AC
  Administered 2017-08-03: 2 via RESPIRATORY_TRACT

## 2017-08-03 MED ORDER — IPRATROPIUM BROMIDE 0.06 % NA SOLN: 2.0000 | Freq: Two times a day (BID) | NASAL | 12 refills | Status: DC

## 2017-08-03 MED ORDER — BENZONATATE 100 MG PO CAPS: 200.0000 mg | ORAL_CAPSULE | Freq: Three times a day (TID) | ORAL | 0 refills | Status: DC | PRN

## 2017-08-03 MED ORDER — BUDESONIDE-FORMOTEROL FUMARATE 160-4.5 MCG/ACT IN AERO: 2.0000 | INHALATION_SPRAY | Freq: Two times a day (BID) | RESPIRATORY_TRACT | 0 refills | Status: DC

## 2017-08-03 MED ORDER — ALBUTEROL SULFATE HFA 108 (90 BASE) MCG/ACT IN AERS
INHALATION_SPRAY | RESPIRATORY_TRACT | Status: AC
  Filled 2017-08-03: qty 6.7

## 2017-08-03 NOTE — Discharge Instructions (Addendum)
Push fluids to ensure adequate hydration and keep secretions thin.  Tylenol and/or ibuprofen as needed for pain or fevers.  Inhaler every 4-6 hours as needed for cough, shortness of breath, or wheezing. May use tessalon as needed for cough.  May continue with mucinex or combination mucinex for symptoms.  Nasal spray 2-4 times a day to help with secretions.  If symptoms worsen or do not improve in the next week to return to be seen or to follow up with your PCP.

## 2017-08-03 NOTE — ED Triage Notes (Signed)
Productive cough for 2 days.

## 2017-08-03 NOTE — ED Provider Notes (Signed)
[facility]    CSN: [id number] Arrival date & time: 08/03/17  1740     History   Chief Complaint Chief Complaint  Patient presents with  . Cough    HPI Norma Morris is a 22 y.o. female.   [place] NewcomerMichaela presents with complaints of productive cough, runny nose, intermittent ear pain for the past two days. Subjective fever. No sore throat. No shortness of breath . No known ill contacts. Denies gi/gu complaints. Has not been using inhaler. History of asthma. Has been taking mucinex which has minimally helped.      Past Medical History:  Diagnosis Date  . Asthma   . 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 Knee gives out     Patient Active Problem List   Diagnosis Date Noted  . Left knee pain 03/11/2011    History reviewed. No pertinent surgical history.  OB History   None      Home Medications    Prior to Admission medications   Medication Sig Start Date End Date Taking? Authorizing Provider  albuterol (PROVENTIL HFA;VENTOLIN HFA) 108 (90 BASE) MCG/ACT inhaler Inhale 2 puffs into the lungs every 4 (four) hours as needed for wheezing or shortness of breath.     [provider]  benzonatate (TESSALON) 100 MG capsule Take 2 capsules (200 mg total) by mouth 3 (three) times daily as needed for cough. 08/03/17   Georgetta HaberBurky, Chamberlain Steinborn B, NP  budesonide-formoterol (SYMBICORT) 160-4.5 MCG/ACT inhaler Inhale 2 puffs into the lungs 2 (two) times daily. 08/03/17   Georgetta HaberBurky, Jackalyn Haith B, NP  cetirizine (ZYRTEC) 10 MG tablet Take 10 mg by mouth daily as needed for allergies.     [provider]  etonogestrel (NEXPLANON) 68 MG IMPL implant 1 each by Subdermal route once.    [provider]  ipratropium (ATROVENT) 0.06 % nasal spray Place 2 sprays into both nostrils 2 (two) times daily. 08/03/17   Georgetta HaberBurky, Angello Chien B, NP    Family History Family History  Problem Relation Age of Onset  . Asthma Mother   .  Heart disease Maternal Grandmother     Social History Social History   Tobacco Use  . Smoking status: Never Smoker  . Smokeless tobacco: Never Used  Substance Use Topics  . Alcohol use: No  . Drug use: No     Allergies   Asa [aspirin] and Flexeril [cyclobenzaprine]   Review of Systems Review of Systems   Physical Exam Triage Vital Signs ED Triage Vitals  Enc Vitals Group     BP 08/03/17 1838 (!) 141/79     Pulse Rate 08/03/17 1838 90     Resp 08/03/17 1838 16     Temp 08/03/17 1838 99.2 F (37.3 C)     Temp Source 08/03/17 1838 Temporal     SpO2 08/03/17 1838 100 %     Weight --      Height --      Head Circumference --      Peak Flow --      Pain Score 08/03/17 1837 6     Pain Loc --      Pain Edu? --      Excl. in GC? --    No data found.  Updated Vital Signs BP (!) 141/79   Pulse 90   Temp 99.2 F (37.3 C) (Temporal)   Resp 16   SpO2 100%    Physical Exam  Constitutional: She is oriented to person, place, and time. She appears well-developed and well-nourished. No distress.  HENT:  Head: Normocephalic and atraumatic.  Right Ear: Tympanic membrane, external ear and ear canal normal.  Left Ear: Tympanic membrane, external ear and ear canal normal.  Nose: Nose normal.  Mouth/Throat: Uvula is midline, oropharynx is clear and moist and mucous membranes are normal. No tonsillar exudate.  Eyes: Pupils are equal, round, and reactive to light. Conjunctivae and EOM are normal.  Cardiovascular: Normal rate, regular rhythm and normal heart sounds.  Pulmonary/Chest: Effort normal and breath sounds normal. She has no wheezes.  Frequent dry cough noted   Neurological: She is alert and oriented to person, place, and time.  Skin: Skin is warm and dry.     UC Treatments / Results  Labs (all labs ordered are listed, but only abnormal results are displayed) Labs Reviewed - No data to display  EKG None  Radiology No results  found.  Procedures Procedures (including critical care time)  Medications Ordered in UC Medications  albuterol (PROVENTIL HFA;VENTOLIN HFA) 108 (90 Base) MCG/ACT inhaler 2 puff (has no administration in time range)    Initial Impression / Assessment and Plan / UC Course  I have reviewed the triage vital signs and the nursing notes.  Pertinent labs & imaging results that were available during my care of the patient were reviewed by me and considered in my medical decision making (see chart for details).     Non toxic, afebrile, vitals stable. Lungs clear. History and physical consistent with viral illness.  Supportive cares recommended. Inhaler provided prior to departure. Return precautions provided. If symptoms worsen or do not improve in the next week to return to be seen or to follow up with PCP.  Patient verbalized understanding and agreeable to plan.   Final Clinical Impressions(s) / UC Diagnoses   Final diagnoses:  Viral URI with cough     Discharge Instructions     Push fluids to ensure adequate hydration and keep secretions thin.  Tylenol and/or ibuprofen as needed for pain or fevers.  Inhaler every 4-6 hours as needed for cough, shortness of breath, or wheezing. May use tessalon as needed for cough.  May continue with mucinex or combination mucinex for symptoms.  Nasal spray 2-4 times a day to help with secretions.  If symptoms worsen or do not improve in the next week to return to be seen or to follow up with your PCP.     ED Prescriptions    Medication Sig Dispense Auth. Provider   benzonatate (TESSALON) 100 MG capsule Take 2 capsules (200 mg total) by mouth 3 (three) times daily as needed for cough. 30 capsule Linus Mako B, NP   ipratropium (ATROVENT) 0.06 % nasal spray Place 2 sprays into both nostrils 2 (two) times daily. 15 mL Linus Mako B, NP   budesonide-formoterol (SYMBICORT) 160-4.5 MCG/ACT inhaler Inhale 2 puffs into the lungs 2 (two) times daily. 1  Inhaler Georgetta Haber, NP     Controlled Substance Prescriptions [place] Controlled Substance Registry consulted? Not Applicable   Georgetta Haber, NP 08/03/17 2110

## 2017-11-23 ENCOUNTER — Emergency Department (HOSPITAL_COMMUNITY)
Admission: EM | Admit: 2017-11-23 | Discharge: 2017-11-24 | Disposition: A | Discharge status: Home / Self Care | Payer: Medicaid Other | Attending: Emergency Medicine | Admitting: Emergency Medicine

## 2017-11-23 ENCOUNTER — Encounter (HOSPITAL_COMMUNITY): Payer: Self-pay

## 2017-11-23 ENCOUNTER — Emergency Department (HOSPITAL_COMMUNITY): Payer: Medicaid Other

## 2017-11-23 DIAGNOSIS — Z8249 Family history of ischemic heart disease and other diseases of the circulatory system: Secondary | ICD-10-CM | POA: Insufficient documentation

## 2017-11-23 DIAGNOSIS — J45909 Unspecified asthma, uncomplicated: Secondary | ICD-10-CM | POA: Insufficient documentation

## 2017-11-23 DIAGNOSIS — Z79899 Other long term (current) drug therapy: Secondary | ICD-10-CM | POA: Insufficient documentation

## 2017-11-23 DIAGNOSIS — I10 Essential (primary) hypertension: Secondary | ICD-10-CM | POA: Insufficient documentation

## 2017-11-23 DIAGNOSIS — R202 Paresthesia of skin: Secondary | ICD-10-CM | POA: Insufficient documentation

## 2017-11-23 LAB — COMPREHENSIVE METABOLIC PANEL
ALK PHOS: 93 U/L (ref 38–126)
ALT: 18 U/L (ref 0–44)
AST: 21 U/L (ref 15–41)
Albumin: 4.5 g/dL (ref 3.5–5.0)
Anion gap: 9 (ref 5–15)
BILIRUBIN TOTAL: 0.5 mg/dL (ref 0.3–1.2)
BUN: 10 mg/dL (ref 6–20)
CALCIUM: 9.4 mg/dL (ref 8.9–10.3)
CO2: 21 mmol/L — ABNORMAL LOW (ref 22–32)
Chloride: 108 mmol/L (ref 98–111)
Creatinine, Ser: 0.97 mg/dL (ref 0.44–1.00)
GFR calc Af Amer: >60 mL/min (ref >60)
Glucose, Bld: 112 mg/dL — ABNORMAL HIGH (ref 70–99)
POTASSIUM: 3.4 mmol/L — AB (ref 3.5–5.1)
Sodium: 138 mmol/L (ref 135–145)
TOTAL PROTEIN: 6.9 g/dL (ref 6.5–8.1)

## 2017-11-23 LAB — CBC
HEMATOCRIT: 42.2 % (ref 36.0–46.0)
Hemoglobin: 13.3 g/dL (ref 12.0–15.0)
MCH: 27 pg (ref 26.0–34.0)
MCHC: 31.5 g/dL (ref 30.0–36.0)
MCV: 85.8 fL (ref 80.0–100.0)
NRBC: 0 % (ref 0.0–0.2)
PLATELETS: 262 10*3/uL (ref 150–400)
RBC: 4.92 MIL/uL (ref 3.87–5.11)
RDW: 11.9 % (ref 11.5–15.5)
WBC: 8.1 10*3/uL (ref 4.0–10.5)

## 2017-11-23 LAB — URINALYSIS, ROUTINE W REFLEX MICROSCOPIC
Bilirubin Urine: NEGATIVE
Glucose, UA: NEGATIVE mg/dL
Hgb urine dipstick: NEGATIVE
KETONES UR: 5 mg/dL — AB
LEUKOCYTES UA: NEGATIVE
NITRITE: NEGATIVE
PROTEIN: NEGATIVE mg/dL
Specific Gravity, Urine: 1.013 (ref 1.005–1.030)
pH: 6 (ref 5.0–8.0)

## 2017-11-23 LAB — I-STAT CHEM 8, ED
BUN: 10 mg/dL (ref 6–20)
CALCIUM ION: 1.13 mmol/L — AB (ref 1.15–1.40)
CHLORIDE: 107 mmol/L (ref 98–111)
CREATININE: 0.8 mg/dL (ref 0.44–1.00)
Glucose, Bld: 106 mg/dL — ABNORMAL HIGH (ref 70–99)
HCT: 40 % (ref 36.0–46.0)
Hemoglobin: 13.6 g/dL (ref 12.0–15.0)
Potassium: 3.4 mmol/L — ABNORMAL LOW (ref 3.5–5.1)
Sodium: 140 mmol/L (ref 135–145)
TCO2: 23 mmol/L (ref 22–32)

## 2017-11-23 LAB — RAPID URINE DRUG SCREEN, HOSP PERFORMED
Amphetamines: NOT DETECTED
BARBITURATES: NOT DETECTED
Benzodiazepines: NOT DETECTED
Cocaine: NOT DETECTED
Opiates: NOT DETECTED
TETRAHYDROCANNABINOL: NOT DETECTED

## 2017-11-23 LAB — DIFFERENTIAL
ABS IMMATURE GRANULOCYTES: 0.02 10*3/uL (ref 0.00–0.07)
Basophils Absolute: 0 10*3/uL (ref 0.0–0.1)
Basophils Relative: 0 %
EOS PCT: 0 %
Eosinophils Absolute: 0 10*3/uL (ref 0.0–0.5)
Immature Granulocytes: 0 %
LYMPHS PCT: 17 %
Lymphs Abs: 1.4 10*3/uL (ref 0.7–4.0)
MONOS PCT: 6 %
Monocytes Absolute: 0.5 10*3/uL (ref 0.1–1.0)
Neutro Abs: 6.2 10*3/uL (ref 1.7–7.7)
Neutrophils Relative %: 77 %

## 2017-11-23 LAB — PROTIME-INR
INR: 1.15
Prothrombin Time: 14.6 seconds (ref 11.4–15.2)

## 2017-11-23 LAB — APTT: aPTT: 31 seconds (ref 24–36)

## 2017-11-23 LAB — ETHANOL: Alcohol, Ethyl (B): <10 mg/dL (ref <10)

## 2017-11-23 LAB — I-STAT TROPONIN, ED: TROPONIN I, POC: 0 ng/mL (ref 0.00–0.08)

## 2017-11-23 LAB — I-STAT BETA HCG BLOOD, ED (MC, WL, AP ONLY)

## 2017-11-23 NOTE — ED Provider Notes (Signed)
[facility] EMERGENCY DEPARTMENT Provider Note   CSN: [id number] Arrival date & time: 11/23/17  1956     History   Chief Complaint Chief Complaint  Patient presents with  . Numbness    HPI Norma Morris is a 22 y.o. female.  HPI   He says she is lying in bed when she began to have a sensation of numbness in the right side of her face, followed by numbness in her right hand.  The numbness in her right hand has nearly resolved.  The numbness in her right face has improved but it is waxing and waning, at the time of evaluation.  Denies headache, palpitations, shortness of breath, weakness, nausea, vomiting, cough, shortness of breath or chest pain.  Is a history of high blood pressure, as an infant can was treated with antihypertensives from age 10 to age 31.  She has not followed up with a PCP or cardiologist in many years.  She works a job, at [employer].  She is under stress because of her job.  She does not smoke cigarettes or drink alcohol or take drugs.  No prior similar problems.  She has had siblings who had Bell's palsy and was wondering if this represents Bell's palsy.  There has been no shaking or seizures today.  There are no other no modifying factors.  Past Medical History:  Diagnosis Date  . Asthma   . 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 Knee gives out     Patient Active Problem List   Diagnosis Date Noted  . Left knee pain 03/11/2011    History reviewed. No pertinent surgical history.   OB History   None      Home Medications    Prior to Admission medications   Medication Sig Start Date End Date Taking? Authorizing Provider  albuterol (PROVENTIL HFA;VENTOLIN HFA) 108 (90 BASE) MCG/ACT inhaler Inhale 2 puffs into the lungs every 4 (four) hours as needed for wheezing or shortness of breath.     [provider]  benzonatate (TESSALON) 100 MG capsule Take 2 capsules (200 mg total)  by mouth 3 (three) times daily as needed for cough. 08/03/17   Georgetta Haber, NP  budesonide-formoterol (SYMBICORT) 160-4.5 MCG/ACT inhaler Inhale 2 puffs into the lungs 2 (two) times daily. 08/03/17   Georgetta Haber, NP  cetirizine (ZYRTEC) 10 MG tablet Take 10 mg by mouth daily as needed for allergies.     [provider]  etonogestrel (NEXPLANON) 68 MG IMPL implant 1 each by Subdermal route once.    [provider]  ipratropium (ATROVENT) 0.06 % nasal spray Place 2 sprays into both nostrils 2 (two) times daily. 08/03/17   Georgetta Haber, NP    Family History Family History  Problem Relation Age of Onset  . Asthma Mother   . Heart disease Maternal Grandmother     Social History Social History   Tobacco Use  . Smoking status: Never Smoker  . Smokeless tobacco: Never Used  Substance Use Topics  . Alcohol use: No  . Drug use: No     Allergies   Asa [aspirin]; Shellfish allergy; and Flexeril [cyclobenzaprine]   Review of Systems Review of Systems  All other systems reviewed and are negative.    Physical Exam Updated Vital Signs BP 128/71   Pulse 85   Temp 98.4 F (36.9 C) (Oral)   Resp (!) 22  Ht 4\' 11"  (1.499 m)   Wt 65.8 kg   LMP 11/19/2017 (Exact Date)   SpO2 98%   BMI 29.29 kg/m   Physical Exam  Constitutional: She is oriented to person, place, and time. She appears well-developed and well-nourished. No distress.  HENT:  Head: Normocephalic and atraumatic.  Eyes: Pupils are equal, round, and reactive to light. Conjunctivae and EOM are normal.  Neck: Normal range of motion and phonation normal. Neck supple.  Cardiovascular: Normal rate and regular rhythm.  Pulmonary/Chest: Effort normal and breath sounds normal. She exhibits no tenderness.  Abdominal: Soft. She exhibits no distension. There is no tenderness. There is no guarding.  Musculoskeletal: Normal range of motion.  Neurological: She is alert and oriented to person, place, and  time. She exhibits normal muscle tone.  No dysarthria, aphasia or nystagmus.  Very minimal light touch dysesthesia of the right mid cheek, and chin region.  No numbness of the forehead.  Very vague facial asymmetry with slight droopiness of the right nasolabial fold.  Symmetric smile.  Normal platysma's function.  Negative head impulse testing.  Negative test of skew.  Skin: Skin is warm and dry.  Psychiatric: She has a normal mood and affect. Her behavior is normal. Judgment and thought content normal.  Nursing note and vitals reviewed.    ED Treatments / Results  Labs (all labs ordered are listed, but only abnormal results are displayed) Labs Reviewed  COMPREHENSIVE METABOLIC PANEL - Abnormal; Notable for the following components:      Result Value   Potassium 3.4 (*)    CO2 21 (*)    Glucose, Bld 112 (*)    All other components within normal limits  URINALYSIS, ROUTINE W REFLEX MICROSCOPIC - Abnormal; Notable for the following components:   Ketones, ur 5 (*)    All other components within normal limits  I-STAT CHEM 8, ED - Abnormal; Notable for the following components:   Potassium 3.4 (*)    Glucose, Bld 106 (*)    Calcium, Ion 1.13 (*)    All other components within normal limits  ETHANOL  PROTIME-INR  APTT  CBC  DIFFERENTIAL  RAPID URINE DRUG SCREEN, HOSP PERFORMED  I-STAT TROPONIN, ED  I-STAT BETA HCG BLOOD, ED ([facility], [facility], [facility] ONLY)    EKG EKG Interpretation  Date/Time:  Monday November 23 2017 22:22:40 EST Ventricular Rate:  101 PR Interval:    QRS Duration: 81 QT Interval:  333 QTC Calculation: 432 R Axis:   69 Text Interpretation:  Sinus tachycardia Borderline T abnormalities, diffuse leads Baseline wander in lead(s) II III aVF V4 Since last tracing rate faster Confirmed by Mancel BaleWentz, Pam Vanalstine [phone]([id number]) on 11/23/2017 11:47:13 PM   Radiology Mr Brain Wo Contrast  Result Date: 11/24/2017 CLINICAL DATA:  Right facial numbness EXAM: MRI HEAD WITHOUT CONTRAST TECHNIQUE:  Multiplanar, multiecho pulse sequences of the brain and surrounding structures were obtained without intravenous contrast. COMPARISON:  None. FINDINGS: BRAIN: There is no acute infarct, acute hemorrhage, hydrocephalus or extra-axial collection. The midline structures are normal. No midline shift or other mass effect. There are no old infarcts. The white matter signal is normal for the patient's age. The cerebral and cerebellar volume are age-appropriate. Susceptibility-sensitive sequences show no chronic microhemorrhage or superficial siderosis. VASCULAR: Major intracranial arterial and venous sinus flow voids are normal. SKULL AND UPPER CERVICAL SPINE: Calvarial bone marrow signal is normal. There is no skull base mass. Visualized upper cervical spine and soft tissues are normal. SINUSES/ORBITS: No fluid levels  or advanced mucosal thickening. No mastoid or middle ear effusion. The orbits are normal. IMPRESSION: Normal brain MRI. Electronically Signed   By: Deatra Robinson M.D.   On: 11/24/2017 00:08    Procedures Procedures (including critical care time)  Medications Ordered in ED Medications - No data to display   Initial Impression / Assessment and Plan / ED Course  I have reviewed the triage vital signs and the nursing notes.  Pertinent labs & imaging results that were available during my care of the patient were reviewed by me and considered in my medical decision making (see chart for details).  Clinical Course as of Nov 25 42  Mon Nov 23, 2017  2220 I discussed the case findings up to this point, with the neuro hospitalist.  He recommends proceeding with evaluation, doing MRI of the brain.  He does not believe that this would be a code stroke case at this time.   [EW]  2348 Normal  Urinalysis, Routine w reflex microscopic(!) [EW]  2348 Normal  Differential [EW]  2348 Normal except potassium low, CO2 low, glucose high  Comprehensive metabolic panel(!) [EW]  2348 Normal  I-Stat beta  hCG blood, ED [EW]  2348 Normal  I-stat troponin, ED [EW]  2348 Normal except potassium low, glucose high, calcium low  I-Stat Chem 8, ED(!) [EW]  2349 Normal  Ethanol [EW]  2349 Normal  CBC [EW]  2349 Prothrombin Time: 14.6 [EW]  Tue Nov 24, 2017  0017 Normal, no CVA or tumor, images reviewed by me  MR BRAIN WO CONTRAST [EW]    Clinical Course User Index [EW] Mancel Bale, MD     Patient Vitals for the past 24 hrs:  BP Temp Temp src Pulse Resp SpO2 Height Weight  11/23/17 2300 128/71 - - 85 (!) 22 98 % - -  11/23/17 2245 125/67 - - 93 12 99 % - -  11/23/17 2230 (!) 145/68 - - 93 18 100 % - -  11/23/17 2029 (!) 162/84 98.4 F (36.9 C) Oral (!) 110 18 100 % - -  11/23/17 2027 - - - - - 100 % 4\' 11"  (1.499 m) 65.8 kg  11/23/17 2002 (!) 157/93 98.2 F (36.8 C) Oral (!) 122 20 100 % - -    12:40 AM Reevaluation with update and discussion. After initial assessment and treatment, an updated evaluation reveals no current numbness on clinical exam, findings discussed with patient and family members, all questions answered. Mancel Bale   Medical Decision Making: Vague symptoms, improving, waxing and waning.  No evidence for code stroke, requiring immediate treatment and/or thrombolysis, on arrival.  Screening for significant occult fixed brain lesion, done with MRI imaging.  MRI is reassuring.  Clinical examination did not worsen following observation.  No indication for further ED treatment or hospitalization, at this time  CRITICAL CARE-no Performed by: Mancel Bale  Nursing Notes Reviewed/ Care Coordinated Applicable Imaging Reviewed Interpretation of Laboratory Data incorporated into ED treatment  The patient appears reasonably screened and/or stabilized for discharge and I doubt any other medical condition or other [facility] requiring further screening, evaluation, or treatment in the ED at this time prior to discharge.  Plan: Home Medications-OTC as needed; Home  Treatments-rest, fluids; return here if the recommended treatment, does not improve the symptoms; Recommended follow up-PCP, PRN   Final Clinical Impressions(s) / ED Diagnoses   Final diagnoses:  Paresthesia    ED Discharge Orders    None  Mancel Bale, MD 11/24/17 [phone]

## 2017-11-23 NOTE — ED Notes (Signed)
Patient transported to MRI 

## 2017-11-23 NOTE — ED Triage Notes (Signed)
Pt arrives from home with family a&ox 4; pt states she started having numbness to right side of face and arm tonight at 1930. Pt denies pain on arrival. Pt has neuro intact on arrival, no gait issues nor weakness noted; Pt is slightly hypertensive with hx; Patient hr at 110 at triage; no other issues noted-Monique,RN

## 2017-11-24 NOTE — Discharge Instructions (Addendum)
The testing today did not reveal any serious problems like stroke, heart attack or blood disorders.  You may have had a migraine, causing your discomfort.  Make sure you are getting plenty of rest, drinking a lot of fluids and eating 3 meals each day.  Follow-up with the doctor of your choice as needed for problems.

## 2018-02-19 DIAGNOSIS — G43909 Migraine, unspecified, not intractable, without status migrainosus: Secondary | ICD-10-CM | POA: Insufficient documentation

## 2018-04-16 DIAGNOSIS — M545 Low back pain, unspecified: Secondary | ICD-10-CM | POA: Insufficient documentation

## 2019-02-05 IMAGING — MR MR HEAD W/O CM
10 of 11 series · 42 of 48 positions shown · non-contrast
Comparison: None.

CLINICAL DATA: Right facial numbness

EXAM:
MRI HEAD WITHOUT CONTRAST
TECHNIQUE: Multiplanar, multiecho pulse sequences of the brain and surrounding
structures were obtained without intravenous contrast.

[Series 5: DWI · axial · 4.0mm · 0.88mm/px · z∈[-35,+97]mm · 8 of 68 slices shown (1 of 4)]
[im 1/68]
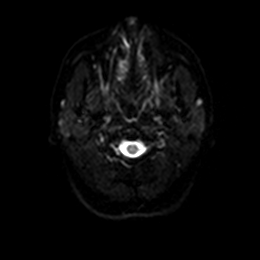
[im 10/68]
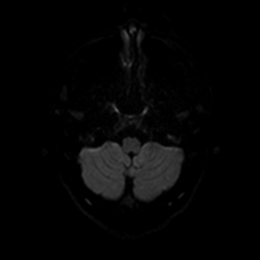
[im 20/68]
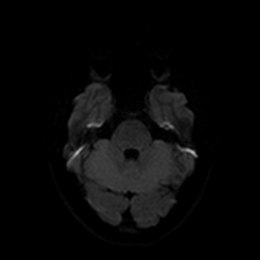
[im 29/68]
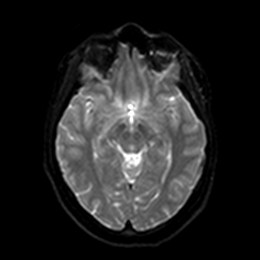
[im 39/68]
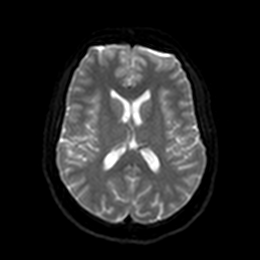
[im 48/68]
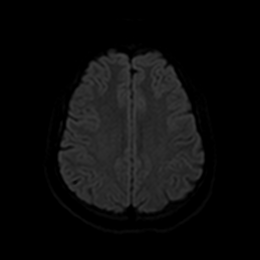
[im 58/68]
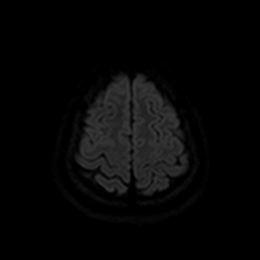
[im 68/68]
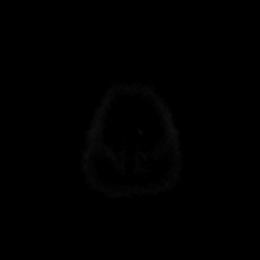

[Series 6: DWI · axial · 4.0mm · 0.88mm/px · z∈[-35,+97]mm · 3 of 34 slices shown (2 of 4)]
[im 1/34]
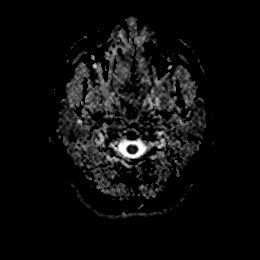
[im 17/34]
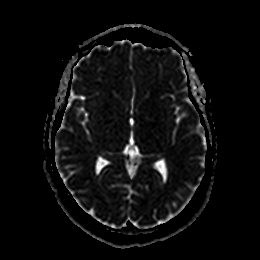
[im 34/34]
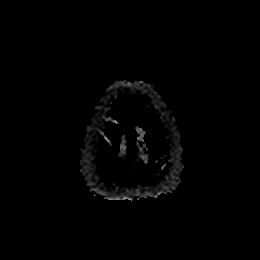

[Series 7: DWI · coronal · 4.0mm · 0.88mm/px · 7 of 70 slices shown (3 of 4)]
[im 1/70]
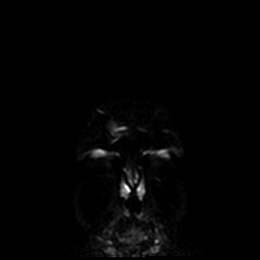
[im 12/70]
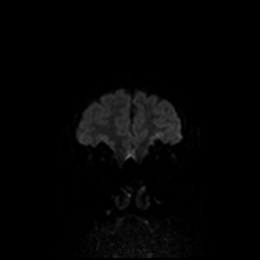
[im 24/70]
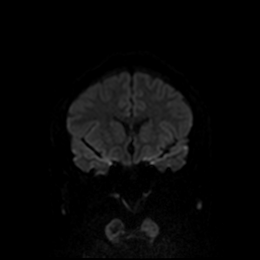
[im 35/70]
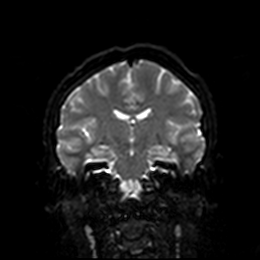
[im 47/70]
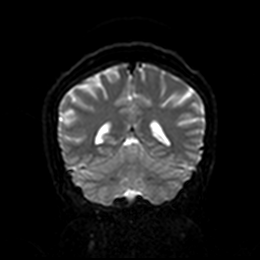
[im 58/70]
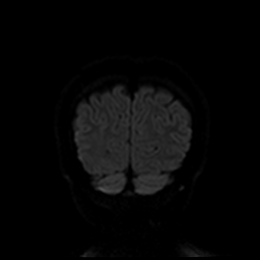
[im 70/70]
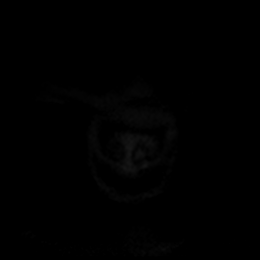

[Series 8: DWI · coronal · 4.0mm · 0.88mm/px · 4 of 35 slices shown (4 of 4)]
[im 1/35]
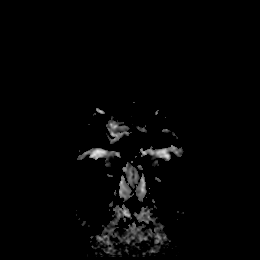
[im 12/35]
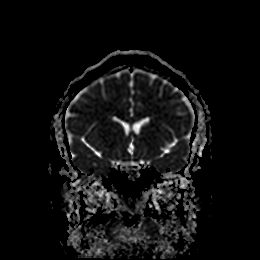
[im 23/35]
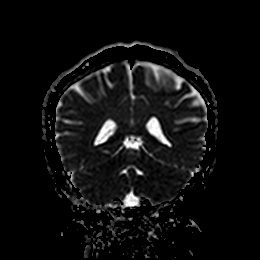
[im 35/35]
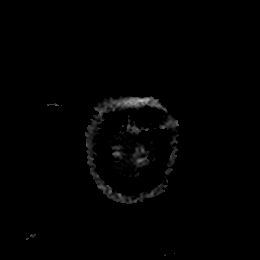

[Series 9: T1 · sagittal · 5.0mm · 0.75mm/px · 2 of 23 slices shown]
[im 1/23]
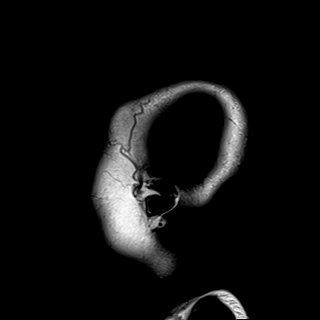
[im 23/23]
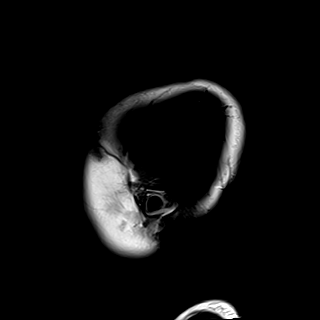

[Series 10: T2 · axial · 5.0mm · 0.72mm/px · z∈[-35,+96]mm · 2 of 23 slices shown (1 of 2)]
[im 1/23]
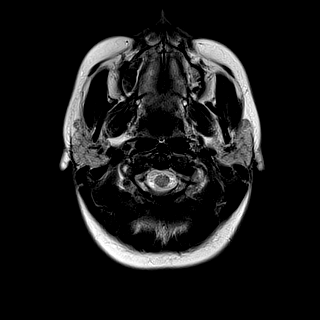
[im 23/23]
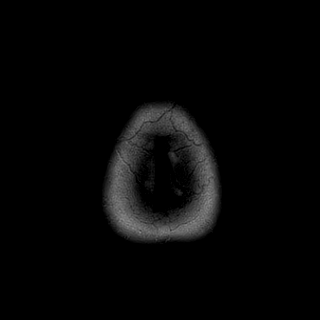

[Series 11: FLAIR · axial · 5.0mm · 0.45mm/px · z∈[-36,+96]mm · 2 of 23 slices shown]
[im 1/23]
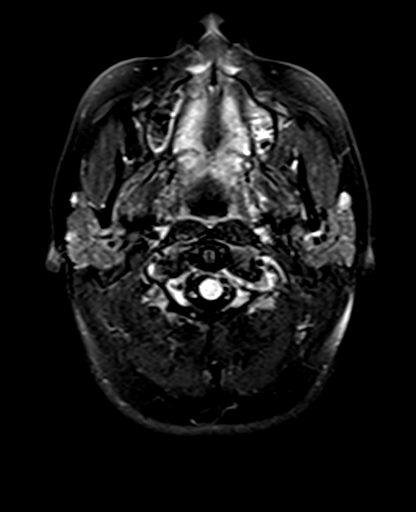
[im 23/23]
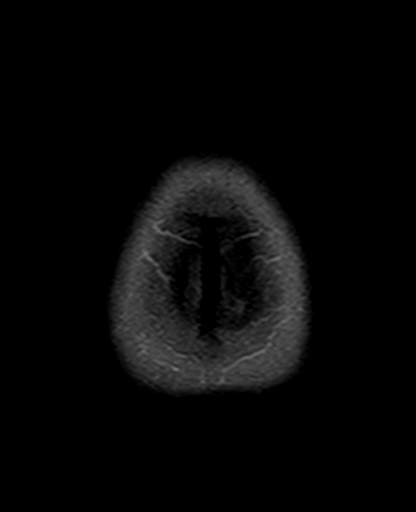

[Series 12: swi_images · axial · 3.0mm · 0.90mm/px · z∈[-53,+123]mm · 6 of 60 slices shown]
[im 1/60]
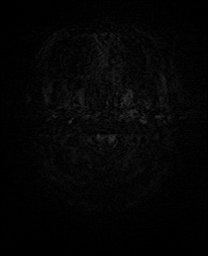
[im 12/60]
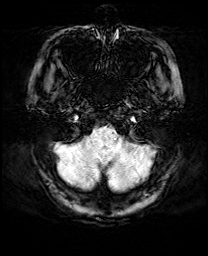
[im 24/60]
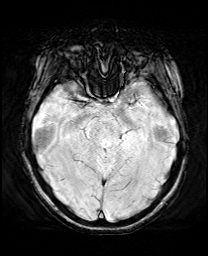
[im 36/60]
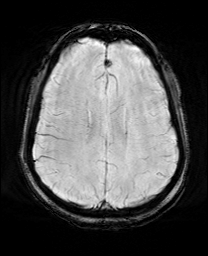
[im 48/60]
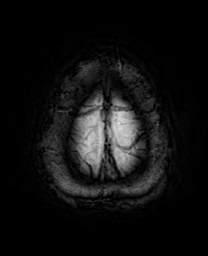
[im 60/60]
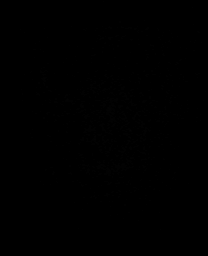

[Series 13: mip_images(sw) · axial · 24.0mm · 0.90mm/px · z∈[-43,+113]mm · 5 of 53 slices shown]
[im 1/53]
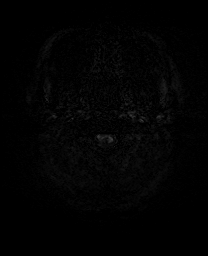
[im 14/53]
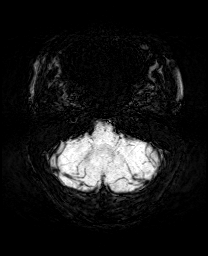
[im 27/53]
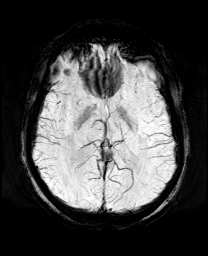
[im 40/53]
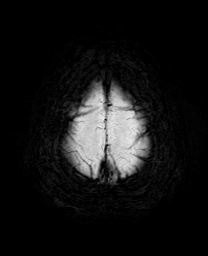
[im 53/53]
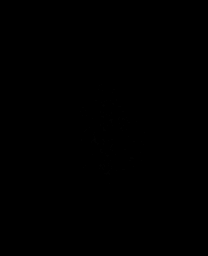

[Series 15: T2 · coronal · 5.0mm · 0.34mm/px · 3 of 29 slices shown (2 of 2)]
[im 1/29]
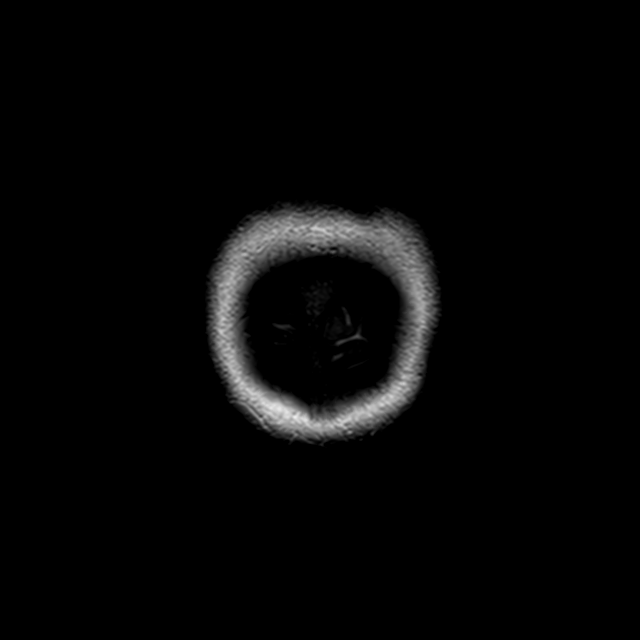
[im 15/29]
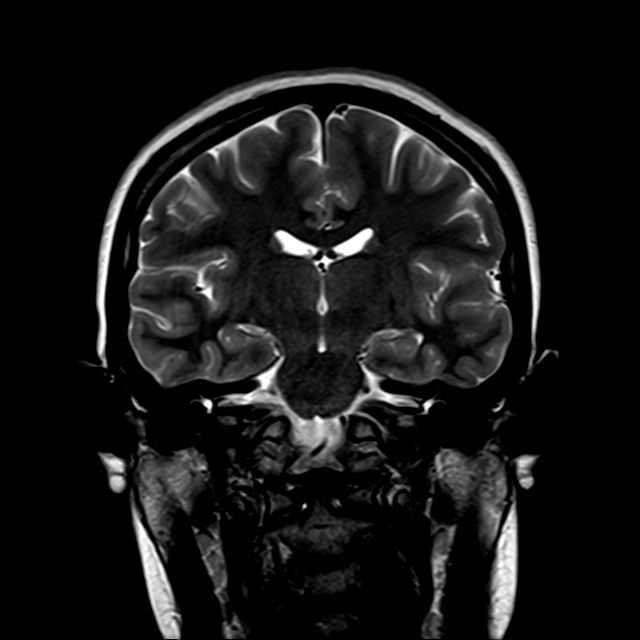
[im 29/29]
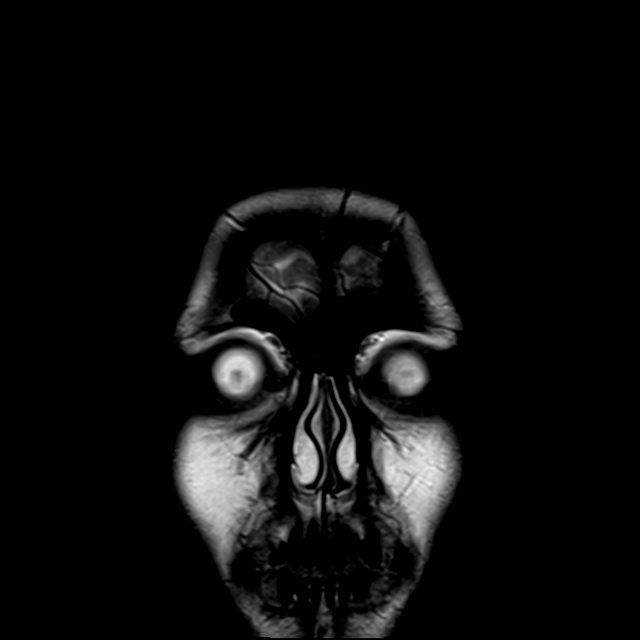

[42 of 48 positions shown; findings below may reference images not displayed]

FINDINGS: BRAIN: There is no acute infarct, acute hemorrhage, hydrocephalus or
extra-axial collection. The midline structures are normal. No
midline shift or other mass effect. There are no old infarcts. The
white matter signal is normal for the patient's age. The cerebral
and cerebellar volume are age-appropriate. Susceptibility-sensitive
sequences show no chronic microhemorrhage or superficial siderosis.

VASCULAR: Major intracranial arterial and venous sinus flow voids
are normal.

SKULL AND UPPER CERVICAL SPINE: Calvarial bone marrow signal is
normal. There is no skull base mass. Visualized upper cervical spine
and soft tissues are normal.

SINUSES/ORBITS: No fluid levels or advanced mucosal thickening. No
mastoid or middle ear effusion. The orbits are normal.
IMPRESSION: Normal brain MRI.

## 2020-03-01 ENCOUNTER — Ambulatory Visit
Admission: RE | Admit: 2020-03-01 | Discharge: 2020-03-01 | Disposition: A | Discharge status: Home / Self Care | Payer: BC Managed Care – PPO | Source: Ambulatory Visit | Attending: Emergency Medicine | Admitting: Emergency Medicine

## 2020-03-01 ENCOUNTER — Other Ambulatory Visit: Payer: Self-pay

## 2020-03-01 VITALS — BP 138/88 | HR 101 | Temp 98.1°F | Resp 16 | Wt 154.0 lb

## 2020-03-01 DIAGNOSIS — Z1152 Encounter for screening for COVID-19: Secondary | ICD-10-CM

## 2020-03-01 DIAGNOSIS — J069 Acute upper respiratory infection, unspecified: Secondary | ICD-10-CM

## 2020-03-01 MED ORDER — ALBUTEROL SULFATE HFA 108 (90 BASE) MCG/ACT IN AERS: 1.0000 | INHALATION_SPRAY | Freq: Four times a day (QID) | RESPIRATORY_TRACT | 0 refills | Status: DC | PRN

## 2020-03-01 MED ORDER — IBUPROFEN 800 MG PO TABS: 800.0000 mg | ORAL_TABLET | Freq: Three times a day (TID) | ORAL | 0 refills | Status: DC

## 2020-03-01 MED ORDER — CETIRIZINE HCL 10 MG PO CAPS: 10.0000 mg | ORAL_CAPSULE | Freq: Every day | ORAL | 0 refills | Status: DC

## 2020-03-01 MED ORDER — BENZONATATE 200 MG PO CAPS: 200.0000 mg | ORAL_CAPSULE | Freq: Three times a day (TID) | ORAL | 0 refills | Status: AC | PRN

## 2020-03-01 MED ORDER — FLUTICASONE PROPIONATE 50 MCG/ACT NA SUSP: 1.0000 | Freq: Every day | NASAL | 0 refills | Status: DC

## 2020-03-01 NOTE — Discharge Instructions (Signed)
Covid test pending, monitor MyChart for results Daily cetirizine and Flonase for congestion and drainage Ibuprofen and Tylenol for pain, headaches Tessalon every 8 hours as needed for cough Albuterol inhaler as needed for shortness of breath chest tightness and wheezing Rest and fluids Follow-up if not improving or worsening

## 2020-03-01 NOTE — ED Provider Notes (Signed)
[facility]    CSN: [id number] Arrival date & time: 03/01/20  0905      History   Chief Complaint Chief Complaint  Patient presents with  . Appointment    0900    HPI Norma Morris is a 25 y.o. female history of asthma, hypertension, presenting today for evaluation of URI symptoms.  Reports that she has had nasal congestion postnasal drainage and slight throat irritation.  Symptoms began yesterday.  Denies any difficulty breathing or shortness of breath.  Does report history of asthma and wanted to catch symptoms before they go more into her chest.  Denies any wheezing or shortness of breath at this time.  Declines having inhalers at home.  Denies sick contacts.  Denies fevers.  HPI  Past Medical History:  Diagnosis Date  . Asthma   . 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 Knee gives out     Patient Active Problem List   Diagnosis Date Noted  . Left knee pain 03/11/2011    Past Surgical History:  Procedure Laterality Date  . dental surgery       OB History   No obstetric history on file.      Home Medications    Prior to Admission medications   Medication Sig Start Date End Date Taking? Authorizing Provider  albuterol (VENTOLIN HFA) 108 (90 Base) MCG/ACT inhaler Inhale 1-2 puffs into the lungs every 6 (six) hours as needed for wheezing or shortness of breath. 03/01/20  Yes Renatta Shrieves C, PA-C  benzonatate (TESSALON) 200 MG capsule Take 1 capsule (200 mg total) by mouth 3 (three) times daily as needed for up to 7 days for cough. 03/01/20 03/08/20 Yes Greer Koeppen C, PA-C  Cetirizine HCl 10 MG CAPS Take 1 capsule (10 mg total) by mouth daily for 10 days. 03/01/20 03/11/20 Yes Arlander Gillen C, PA-C  fluticasone (FLONASE) 50 MCG/ACT nasal spray Place 1-2 sprays into both nostrils daily. 03/01/20  Yes Legacy Carrender C, PA-C  ibuprofen (ADVIL) 800 MG tablet Take 1 tablet (800 mg total) by mouth 3  (three) times daily. 03/01/20  Yes Taffy Delconte C, PA-C  budesonide-formoterol (SYMBICORT) 160-4.5 MCG/ACT inhaler Inhale 2 puffs into the lungs 2 (two) times daily. 08/03/17   Georgetta Haber, NP  etonogestrel (NEXPLANON) 68 MG IMPL implant 1 each by Subdermal route once.    [provider]  ipratropium (ATROVENT) 0.06 % nasal spray Place 2 sprays into both nostrils 2 (two) times daily. 08/03/17   Georgetta Haber, NP    Family History Family History  Problem Relation Age of Onset  . Asthma Mother   . Heart disease Maternal Grandmother     Social History Social History   Tobacco Use  . Smoking status: Never Smoker  . Smokeless tobacco: Never Used  Substance Use Topics  . Alcohol use: No  . Drug use: No     Allergies   Asa [aspirin], Shellfish allergy, and Flexeril [cyclobenzaprine]   Review of Systems Review of Systems  Constitutional: Negative for activity change, appetite change, chills, fatigue and fever.  HENT: Positive for congestion, rhinorrhea, sinus pressure and sore throat. Negative for ear pain and trouble swallowing.   Eyes: Negative for discharge and redness.  Respiratory: Positive for cough. Negative for chest tightness and shortness of breath.   Cardiovascular: Negative for chest pain.  Gastrointestinal: Negative for abdominal pain, diarrhea, nausea and vomiting.  Musculoskeletal: Negative for  myalgias.  Skin: Negative for rash.  Neurological: Negative for dizziness, light-headedness and headaches.     Physical Exam Triage Vital Signs ED Triage Vitals  Enc Vitals Group     BP 03/01/20 0943 138/88     Pulse Rate 03/01/20 0943 (S) (!) 101     Resp 03/01/20 0943 16     Temp 03/01/20 0943 98.1 F (36.7 C)     Temp Source 03/01/20 0943 Oral     SpO2 03/01/20 0943 98 %     Weight 03/01/20 0942 154 lb (69.9 kg)     Height --      Head Circumference --      Peak Flow --      Pain Score 03/01/20 0942 0     Pain Loc --      Pain Edu? --       Excl. in GC? --    No data found.  Updated Vital Signs BP 138/88 (BP Location: Left Arm)   Pulse (S) (!) 101   Temp 98.1 F (36.7 C) (Oral)   Resp 16   Wt 154 lb (69.9 kg)   SpO2 98%   BMI 31.10 kg/m   Visual Acuity Right Eye Distance:   Left Eye Distance:   Bilateral Distance:    Right Eye Near:   Left Eye Near:    Bilateral Near:     Physical Exam Vitals and nursing note reviewed.  Constitutional:      Appearance: She is well-developed and well-nourished.     Comments: No acute distress  HENT:     Head: Normocephalic and atraumatic.     Ears:     Comments: Bilateral ears without tenderness to palpation of external auricle, tragus and mastoid, EAC's without erythema or swelling, TM's with good bony landmarks and cone of light. Non erythematous.     Nose: Nose normal.     Mouth/Throat:     Comments: Oral mucosa pink and moist, no tonsillar enlargement or exudate. Posterior pharynx patent and nonerythematous, no uvula deviation or swelling. Normal phonation. Eyes:     Conjunctiva/sclera: Conjunctivae normal.  Cardiovascular:     Rate and Rhythm: Normal rate.  Pulmonary:     Effort: Pulmonary effort is normal. No respiratory distress.     Comments: Breathing comfortably at rest, CTABL, no wheezing, rales or other adventitious sounds auscultated Abdominal:     General: There is no distension.  Musculoskeletal:        General: Normal range of motion.     Cervical back: Neck supple.  Skin:    General: Skin is warm and dry.  Neurological:     Mental Status: She is alert and oriented to person, place, and time.  Psychiatric:        Mood and Affect: Mood and affect normal.      UC Treatments / Results  Labs (all labs ordered are listed, but only abnormal results are displayed) Labs Reviewed  NOVEL CORONAVIRUS, NAA    EKG   Radiology No results found.  Procedures Procedures (including critical care time)  Medications Ordered in UC Medications - No  data to display  Initial Impression / Assessment and Plan / UC Course  I have reviewed the triage vital signs and the nursing notes.  Pertinent labs & imaging results that were available during my care of the patient were reviewed by me and considered in my medical decision making (see chart for details).     Viral URI with cough-Covid  test pending, exam reassuring, lungs clear to auscultation, recommend symptomatic and supportive care rest and fluids.  Discussed strict return precautions. Patient verbalized understanding and is agreeable with plan.  Final Clinical Impressions(s) / UC Diagnoses   Final diagnoses:  Viral URI with cough     Discharge Instructions     Covid test pending, monitor MyChart for results Daily cetirizine and Flonase for congestion and drainage Ibuprofen and Tylenol for pain, headaches Tessalon every 8 hours as needed for cough Albuterol inhaler as needed for shortness of breath chest tightness and wheezing Rest and fluids Follow-up if not improving or worsening    ED Prescriptions    Medication Sig Dispense Auth. Provider   albuterol (VENTOLIN HFA) 108 (90 Base) MCG/ACT inhaler Inhale 1-2 puffs into the lungs every 6 (six) hours as needed for wheezing or shortness of breath. 18 g Ishi Danser C, PA-C   Cetirizine HCl 10 MG CAPS Take 1 capsule (10 mg total) by mouth daily for 10 days. 10 capsule Dian Laprade C, PA-C   fluticasone (FLONASE) 50 MCG/ACT nasal spray Place 1-2 sprays into both nostrils daily. 16 g Kaesyn Johnston C, PA-C   benzonatate (TESSALON) 200 MG capsule Take 1 capsule (200 mg total) by mouth 3 (three) times daily as needed for up to 7 days for cough. 28 capsule Taten Merrow C, PA-C   ibuprofen (ADVIL) 800 MG tablet Take 1 tablet (800 mg total) by mouth 3 (three) times daily. 21 tablet Jalaiya Oyster, [place] C, PA-C     PDMP not reviewed this encounter.   Lew Dawes, [place] 03/01/20 1126

## 2020-03-01 NOTE — ED Triage Notes (Signed)
Patient presents to Urgent Care with complaints of runny nose and some irritation since Yesterday. Treating discomfort with Tylenol. Has a hx of allergies. She reports she is concerned with upper respiratory infection.   Denies fever.

## 2020-03-02 LAB — SARS-COV-2, NAA 2 DAY TAT

## 2020-03-02 LAB — NOVEL CORONAVIRUS, NAA: SARS-CoV-2, NAA: NOT DETECTED

## 2020-05-28 ENCOUNTER — Other Ambulatory Visit: Payer: Self-pay

## 2020-05-28 ENCOUNTER — Ambulatory Visit
Admission: EM | Admit: 2020-05-28 | Discharge: 2020-05-28 | Disposition: A | Discharge status: Home / Self Care | Payer: BC Managed Care – PPO | Attending: Emergency Medicine | Admitting: Emergency Medicine

## 2020-05-28 DIAGNOSIS — R112 Nausea with vomiting, unspecified: Secondary | ICD-10-CM | POA: Diagnosis not present

## 2020-05-28 LAB — POCT URINALYSIS DIP (MANUAL ENTRY)
Bilirubin, UA: NEGATIVE
Blood, UA: NEGATIVE
Glucose, UA: NEGATIVE mg/dL
Leukocytes, UA: NEGATIVE
Nitrite, UA: NEGATIVE
Spec Grav, UA: >=1.03 — AB (ref 1.010–1.025)
Urobilinogen, UA: 0.2 E.U./dL
pH, UA: 6.5 (ref 5.0–8.0)

## 2020-05-28 LAB — POCT URINE PREGNANCY: Preg Test, Ur: NEGATIVE

## 2020-05-28 MED ORDER — ONDANSETRON 4 MG PO TBDP: 4.0000 mg | ORAL_TABLET | Freq: Three times a day (TID) | ORAL | 0 refills | Status: AC | PRN

## 2020-05-28 MED ORDER — ONDANSETRON 4 MG PO TBDP
4.0000 mg | ORAL_TABLET | Freq: Once | ORAL | Status: AC
  Administered 2020-05-28: 4 mg via ORAL

## 2020-05-28 NOTE — Discharge Instructions (Signed)
You can take Zofran up to three times a day for nausea and vomiting.

## 2020-05-28 NOTE — ED Provider Notes (Signed)
[facility]  ____________________________________________  Time seen: Approximately 7:35 PM  I have reviewed the triage vital signs and the nursing notes.   HISTORY  Chief Complaint Nausea, Emesis, and Abdominal Pain   Historian Patient    HPI Norma Morris is a 25 y.o. female presents to the urgent care with abdominal cramping that started around 7 AM and 4 episodes of nonbloody emesis.  Patient states that she has had no rhinorrhea, nasal congestion or nonproductive cough.  No fever today.  No diarrhea.  Patient states that she has some epigastric abdominal discomfort after vomiting but no consistent pain.  No prior abdominal surgeries.  She denies known sick contacts at home or at work.  No chest pain, chest tightness or shortness of breath.  Patient denies possibility of pregnancy.   Past Medical History:  Diagnosis Date  . Asthma   . 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 Knee gives out      Immunizations up to date:  Yes.     Past Medical History:  Diagnosis Date  . Asthma   . 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 Knee gives out     Patient Active Problem List   Diagnosis Date Noted  . Left knee pain 03/11/2011    Past Surgical History:  Procedure Laterality Date  . dental surgery       Prior to Admission medications   Medication Sig Start Date End Date Taking? Authorizing Provider  ondansetron (ZOFRAN-ODT) 4 MG disintegrating tablet Take 1 tablet (4 mg total) by mouth every 8 (eight) hours as needed for up to 5 days for nausea or vomiting. 05/28/20 06/02/20 Yes Pia Mau M, PA-C  albuterol (VENTOLIN HFA) 108 (90 Base) MCG/ACT inhaler Inhale 1-2 puffs into the lungs every 6 (six) hours as needed for wheezing or shortness of breath. 03/01/20   Wieters, Hallie C, PA-C  budesonide-formoterol (SYMBICORT) 160-4.5 MCG/ACT inhaler Inhale 2  puffs into the lungs 2 (two) times daily. 08/03/17   Georgetta Haber, NP  Cetirizine HCl 10 MG CAPS Take 1 capsule (10 mg total) by mouth daily for 10 days. 03/01/20 03/11/20  Wieters, Hallie C, PA-C  etonogestrel (NEXPLANON) 68 MG IMPL implant 1 each by Subdermal route once.    [provider]  ibuprofen (ADVIL) 800 MG tablet Take 1 tablet (800 mg total) by mouth 3 (three) times daily. 03/01/20   Wieters, Hallie C, PA-C    Allergies Asa [aspirin], Shellfish allergy, and Flexeril [cyclobenzaprine]  Family History  Problem Relation Age of Onset  . Asthma Mother   . Heart disease Maternal Grandmother     Social History Social History   Tobacco Use  . Smoking status: Never Smoker  . Smokeless tobacco: Never Used  Substance Use Topics  . Alcohol use: Yes    Comment: occ  . Drug use: No     Review of Systems  Constitutional: No fever/chills Eyes:  No discharge ENT: No upper respiratory complaints. Respiratory: no cough. No SOB/ use of accessory muscles to breath Gastrointestinal: Patient has abdominal discomfort and nausea.  Musculoskeletal: Negative for musculoskeletal pain. Skin: Negative for rash, abrasions, lacerations, ecchymosis.   ____________________________________________   PHYSICAL EXAM:  VITAL SIGNS: ED Triage Vitals  Enc Vitals Group     BP 05/28/20 1921 127/80     Pulse Rate 05/28/20 1921 (!) 112  Resp 05/28/20 1921 18     Temp 05/28/20 1921 99.2 F (37.3 C)     Temp Source 05/28/20 1921 Oral     SpO2 05/28/20 1921 96 %     Weight --      Height --      Head Circumference --      Peak Flow --      Pain Score 05/28/20 1926 5     Pain Loc --      Pain Edu? --      Excl. in GC? --      Constitutional: Alert and oriented. Well appearing and in no acute distress. Eyes: Conjunctivae are normal. PERRL. EOMI. Head: Atraumatic. ENT:      Nose: No congestion/rhinnorhea.      Mouth/Throat: Mucous membranes are moist.  Neck: No stridor.  No  cervical spine tenderness to palpation. Cardiovascular: Normal rate, regular rhythm. Normal S1 and S2.  Good peripheral circulation. Respiratory: Normal respiratory effort without tachypnea or retractions. Lungs CTAB. Good air entry to the bases with no decreased or absent breath sounds Gastrointestinal: Bowel sounds x 4 quadrants. Soft and nontender to palpation. No guarding or rigidity. No distention. Musculoskeletal: Full range of motion to all extremities. No obvious deformities noted Neurologic:  Normal for age. No gross focal neurologic deficits are appreciated.  Skin:  Skin is warm, dry and intact. No rash noted. Psychiatric: Mood and affect are normal for age. Speech and behavior are normal.   ____________________________________________   LABS (all labs ordered are listed, but only abnormal results are displayed)  Labs Reviewed  POCT URINALYSIS DIP (MANUAL ENTRY) - Abnormal; Notable for the following components:      Result Value   Clarity, UA cloudy (*)    Ketones, POC UA small (15) (*)    Spec Grav, UA >=1.030 (*)    Protein Ur, POC trace (*)    All other components within normal limits  COVID-19, FLU A+B NAA  POCT URINE PREGNANCY   ____________________________________________  EKG   ____________________________________________  RADIOLOGY Geraldo Pitter, personally viewed and evaluated these images (plain radiographs) as part of my medical decision making, as well as reviewing the written report by the radiologist.    No results found.  ____________________________________________    PROCEDURES  Procedure(s) performed:     Procedures     Medications  ondansetron (ZOFRAN-ODT) disintegrating tablet 4 mg (4 mg Oral Given 05/28/20 1941)     ____________________________________________   INITIAL IMPRESSION / ASSESSMENT AND PLAN / ED COURSE  Pertinent labs & imaging results that were available during my care of the patient were reviewed by me and  considered in my medical decision making (see chart for details).      Assessment and Plan:  Abdominal cramping Nausea:  25 year old female presents to the urgent care with acute onset of vomiting that started today.  Patient was mildly tachycardic at triage but vital signs were otherwise reassuring.  Suspect unspecified viral infection at this time.  COVID-19 and flu testing results are in process at this time.  Pregnancy testing is negative.  Urinalysis did not suggest UTI.  Patient felt improved after Zofran.  Recommended rest and hydration at home.  Tylenol and ibuprofen alternating were recommended if fever occurs.  ____________________________________________  FINAL CLINICAL IMPRESSION(S) / ED DIAGNOSES  Final diagnoses:  Non-intractable vomiting with nausea, unspecified vomiting type      NEW MEDICATIONS STARTED DURING THIS VISIT:  ED Discharge Orders  Ordered    ondansetron (ZOFRAN-ODT) 4 MG disintegrating tablet  Every 8 hours PRN        05/28/20 2003              This chart was dictated using voice recognition software/Dragon. Despite best efforts to proofread, errors can occur which can change the meaning. Any change was purely unintentional.     Orvil Feil, PA-C 05/28/20 2007

## 2020-05-28 NOTE — ED Triage Notes (Signed)
Pt presents today with an onset this morning at  approx 7am of abdominal "cramping" pain, nausea and intermittent HA. She notes 4 episodes of emesis. Confirms decreased appetite and PO liquid intake. Denies diarrhea and constipation. Pt reports that she had bologna sandwich last night with mustard that she later discovered was expired.

## 2020-05-31 LAB — COVID-19, FLU A+B NAA
Influenza A, NAA: NOT DETECTED
Influenza B, NAA: NOT DETECTED
SARS-CoV-2, NAA: NOT DETECTED

## 2020-12-09 ENCOUNTER — Ambulatory Visit
Admission: EM | Admit: 2020-12-09 | Discharge: 2020-12-09 | Disposition: A | Discharge status: Home / Self Care | Payer: BC Managed Care – PPO | Attending: Internal Medicine | Admitting: Internal Medicine

## 2020-12-09 DIAGNOSIS — R0602 Shortness of breath: Secondary | ICD-10-CM

## 2020-12-09 DIAGNOSIS — J069 Acute upper respiratory infection, unspecified: Secondary | ICD-10-CM | POA: Diagnosis not present

## 2020-12-09 MED ORDER — BENZONATATE 100 MG PO CAPS: 100.0000 mg | ORAL_CAPSULE | Freq: Three times a day (TID) | ORAL | 0 refills | Status: DC | PRN

## 2020-12-09 MED ORDER — ALBUTEROL SULFATE HFA 108 (90 BASE) MCG/ACT IN AERS: 1.0000 | INHALATION_SPRAY | Freq: Four times a day (QID) | RESPIRATORY_TRACT | 0 refills | Status: AC | PRN

## 2020-12-09 MED ORDER — PREDNISONE 20 MG PO TABS: 40.0000 mg | ORAL_TABLET | Freq: Every day | ORAL | 0 refills | Status: AC

## 2020-12-09 NOTE — ED Triage Notes (Signed)
3 day h/o congestion, cough  and onset yesterday of sinus pressure. Hs been taking mucinex w/relief. Also, using albuterol with temporary relief.

## 2020-12-09 NOTE — ED Provider Notes (Signed)
[facility]    CSN: [id number] Arrival date & time: 12/09/20  0945      History   Chief Complaint Chief Complaint  Patient presents with   Nasal Congestion    HPI SHAINA MAHAN is a 25 y.o. female.   Patient presents with 3-day history of nasal congestion, cough, sinus pressure.  Patient has been taking Mucinex with minimal improvement in symptoms.  Patient also endorsea some shortness of breath that started this morning that is intermittent and mainly with exertion.  Has been using her albuterol inhaler with temporary relief.  Patient does have history of asthma.  Denies any fevers or known sick contacts.  Denies chest pain, nausea, vomiting, diarrhea, ear pain, sore throat.    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      Home Medications    Prior to Admission medications   Medication Sig Start Date End Date Taking? Authorizing Provider  albuterol (VENTOLIN HFA) 108 (90 Base) MCG/ACT inhaler Inhale 1-2 puffs into the lungs every 6 (six) hours as needed for wheezing or shortness of breath. 12/09/20  Yes Shlomie Romig, Hildred Alamin E, FNP  benzonatate (TESSALON) 100 MG capsule Take 1 capsule (100 mg total) by mouth every 8 (eight) hours as needed for cough. 12/09/20  Yes Gerrett Loman, Hildred Alamin E, FNP  predniSONE (DELTASONE) 20 MG tablet Take 2 tablets (40 mg total) by mouth daily for 5 days. 12/09/20 12/14/20 Yes Shatira Dobosz, Michele Rockers, FNP  budesonide-formoterol (SYMBICORT) 160-4.5 MCG/ACT inhaler Inhale 2 puffs into the lungs 2 (two) times daily. 08/03/17   Zigmund Gottron, NP  Cetirizine HCl 10 MG CAPS Take 1 capsule (10 mg total) by mouth daily for 10 days. 03/01/20  03/11/20  Wieters, Hallie C, PA-C  etonogestrel (NEXPLANON) 68 MG IMPL implant 1 each by Subdermal route once.    [provider]  ibuprofen (ADVIL) 800 MG tablet Take 1 tablet (800 mg total) by mouth 3 (three) times daily. 03/01/20   Wieters, Elesa Hacker, PA-C    Family History Family History  Problem Relation Age of Onset   Asthma Mother    Heart disease Maternal Grandmother     Social History Social History   Tobacco Use   Smoking status: Never   Smokeless tobacco: Never  Substance Use Topics   Alcohol use: Yes    Comment: occ   Drug use: No     Allergies   Asa [aspirin], Shellfish allergy, and Flexeril [cyclobenzaprine]   Review of Systems Review of Systems Per HPI  Physical Exam Triage Vital Signs ED Triage Vitals  Enc Vitals Group     BP 12/09/20 1030 (!) 145/90     Pulse Rate 12/09/20 1030 87     Resp 12/09/20 1030 18     Temp 12/09/20 1030 98 F (36.7 C)     Temp Source 12/09/20 1030 Oral     SpO2 12/09/20 1030 98 %     Weight --      Height --      Head Circumference --      Peak Flow --  Pain Score 12/09/20 1032 3     Pain Loc --      Pain Edu? --      Excl. in [place]? --    No data found.  Updated Vital Signs BP (!) 145/90 (BP Location: Left Arm)   Pulse 87   Temp 98 F (36.7 C) (Oral)   Resp 18   SpO2 98%   Visual Acuity Right Eye Distance:   Left Eye Distance:   Bilateral Distance:    Right Eye Near:   Left Eye Near:    Bilateral Near:     Physical Exam Constitutional:      General: She is not in acute distress.    Appearance: Normal appearance. She is not toxic-appearing or diaphoretic.  HENT:     Head: Normocephalic and atraumatic.     Right Ear: Tympanic membrane and ear canal normal.     Left Ear: Tympanic membrane and ear canal normal.     Nose: Congestion present.     Mouth/Throat:     Mouth: Mucous membranes are moist.     Pharynx: No posterior oropharyngeal erythema.  Eyes:     Extraocular Movements:  Extraocular movements intact.     Conjunctiva/sclera: Conjunctivae normal.     Pupils: Pupils are equal, round, and reactive to light.  Cardiovascular:     Rate and Rhythm: Normal rate and regular rhythm.     Pulses: Normal pulses.     Heart sounds: Normal heart sounds.  Pulmonary:     Effort: Pulmonary effort is normal. No respiratory distress.     Breath sounds: Normal breath sounds. No stridor. No wheezing, rhonchi or rales.  Abdominal:     General: Abdomen is flat. Bowel sounds are normal.     Palpations: Abdomen is soft.  Musculoskeletal:        General: Normal range of motion.     Cervical back: Normal range of motion.  Skin:    General: Skin is warm and dry.  Neurological:     General: No focal deficit present.     Mental Status: She is alert and oriented to person, place, and time. Mental status is at baseline.  Psychiatric:        Mood and Affect: Mood normal.        Behavior: Behavior normal.     UC Treatments / Results  Labs (all labs ordered are listed, but only abnormal results are displayed) Labs Reviewed  COVID-19, FLU A+B NAA    EKG   Radiology No results found.  Procedures Procedures (including critical care time)  Medications Ordered in UC Medications - No data to display  Initial Impression / Assessment and Plan / UC Course  I have reviewed the triage vital signs and the nursing notes.  Pertinent labs & imaging results that were available during my care of the patient were reviewed by me and considered in my medical decision making (see chart for details).     Patient presents with symptoms likely from a viral upper respiratory infection. Differential includes bacterial pneumonia, sinusitis, allergic rhinitis, Covid 19, flu. Do not suspect underlying cardiopulmonary process. Symptoms seem unlikely related to ACS, CHF or COPD exacerbations, pneumonia, pneumothorax. Patient is nontoxic appearing and not in need of emergent medical intervention.   COVID-19 and flu test is pending.  Recommended symptom control with over the counter medications: Daily oral anti-histamine, Oral decongestant or IN corticosteroid, saline irrigations, cepacol lozenges, Robitussin, Delsym, honey tea.  High risk for asthma exacerbation  so will prescribe prednisone x5 days.  Albuterol inhaler refilled.  Benzonatate to take as needed for cough.  Do not think that chest imaging is necessary given that there are no adventitious lung sounds on exam.  Return if symptoms fail to improve in 1-2 weeks or you develop shortness of breath, chest pain, severe headache. Patient states understanding and is agreeable.  Discharged with PCP followup.  Final Clinical Impressions(s) / UC Diagnoses   Final diagnoses:  Viral upper respiratory tract infection with cough  Shortness of breath     Discharge Instructions      It appears that you have a viral upper respiratory infection that should resolve in the next few days with symptomatic treatment.  You may be having an asthma exacerbation as well.  Prednisone has been prescribed to help treat this.  Your albuterol inhaler has been refilled.  Cough medication has also been prescribed for you to take as needed.   ED Prescriptions     Medication Sig Dispense Auth. Provider   benzonatate (TESSALON) 100 MG capsule Take 1 capsule (100 mg total) by mouth every 8 (eight) hours as needed for cough. 21 capsule [place], [place] E, [place]   predniSONE (DELTASONE) 20 MG tablet Take 2 tablets (40 mg total) by mouth daily for 5 days. 10 tablet [place], Rolly Salter E, [place]   albuterol (VENTOLIN HFA) 108 (90 Base) MCG/ACT inhaler Inhale 1-2 puffs into the lungs every 6 (six) hours as needed for wheezing or shortness of breath. 1 each Gustavus Bryant, [place]      PDMP not reviewed this encounter.   Gustavus Bryant, [place] 12/09/20 [id number]

## 2020-12-09 NOTE — Discharge Instructions (Signed)
It appears that you have a viral upper respiratory infection that should resolve in the next few days with symptomatic treatment.  You may be having an asthma exacerbation as well.  Prednisone has been prescribed to help treat this.  Your albuterol inhaler has been refilled.  Cough medication has also been prescribed for you to take as needed.

## 2020-12-10 LAB — COVID-19, FLU A+B NAA
Influenza A, NAA: NOT DETECTED
Influenza B, NAA: NOT DETECTED
SARS-CoV-2, NAA: NOT DETECTED

## 2021-02-21 ENCOUNTER — Encounter: Payer: Self-pay | Admitting: Emergency Medicine

## 2021-02-21 ENCOUNTER — Ambulatory Visit
Admission: EM | Admit: 2021-02-21 | Discharge: 2021-02-21 | Disposition: A | Discharge status: Home / Self Care | Payer: BC Managed Care – PPO

## 2021-02-21 ENCOUNTER — Other Ambulatory Visit: Payer: Self-pay

## 2021-02-21 DIAGNOSIS — M25521 Pain in right elbow: Secondary | ICD-10-CM | POA: Diagnosis not present

## 2021-02-21 NOTE — Discharge Instructions (Signed)
It appears that you have a muscular injury.  Please use ice application.  Follow-up with orthopedist for further evaluation and management.

## 2021-02-21 NOTE — ED Triage Notes (Signed)
Patient states that when she pushed to stand up to get out of the bed yesterday morning, she noticed a pain in her right elbow.  No apparent injury.  Patient has been having continued pain.  Patient denies any OTC meds.

## 2021-02-21 NOTE — ED Provider Notes (Signed)
[facility]    CSN: [id number] Arrival date & time: 02/21/21  [place]      History   Chief Complaint Chief Complaint  Patient presents with   Elbow Pain    HPI Norma Morris is a 26 y.o. female.   Patient presents with right elbow pain that started yesterday morning.  Patient denies any apparent injury or past injury.  She does report that she does a lot of heavy lifting at work as she works for [employer].  Pain mainly occurs with bearing weight and with range of motion.  Denies any numbness or tingling.  Patient has not taken any medications to alleviate symptoms.    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      Home Medications    Prior to Admission medications   Medication Sig Start Date End Date Taking? Authorizing Provider  albuterol (VENTOLIN HFA) 108 (90 Base) MCG/ACT inhaler Inhale 1-2 puffs into the lungs every 6 (six) hours as needed for wheezing or shortness of breath. 12/09/20  Yes Cornisha Zetino, Hildred Alamin E, FNP  benzonatate (TESSALON) 100 MG capsule Take 1 capsule (100 mg total) by mouth every 8 (eight) hours as needed for cough. 12/09/20  Yes Claudett Bayly, Hildred Alamin E, FNP  budesonide-formoterol (SYMBICORT) 160-4.5 MCG/ACT inhaler Inhale 2 puffs into the lungs 2 (two) times daily. 08/03/17  Yes Augusto Gamble B, NP  etonogestrel (NEXPLANON) 68 MG IMPL implant 1 each by Subdermal route once.   Yes [provider]  ibuprofen (ADVIL) 800 MG tablet Take 1 tablet (800 mg total) by mouth 3 (three) times daily. 03/01/20  Yes Wieters, Hallie C, PA-C  Cetirizine HCl 10 MG CAPS Take 1 capsule (10 mg total) by mouth daily for 10 days. 03/01/20 03/11/20   Janith Lima, PA-C    Family History Family History  Problem Relation Age of Onset   Asthma Mother    Heart disease Maternal Grandmother     Social History Social History   Tobacco Use   Smoking status: Never   Smokeless tobacco: Never  Substance Use Topics   Alcohol use: Yes    Comment: occ   Drug use: No     Allergies   Asa [aspirin], Shellfish allergy, and Flexeril [cyclobenzaprine]   Review of Systems Review of Systems Per HPI  Physical Exam Triage Vital Signs ED Triage Vitals  Enc Vitals Group     BP 02/21/21 1721 134/86     Pulse Rate 02/21/21 1721 69     Resp 02/21/21 1721 20     Temp 02/21/21 1721 97.8 F (36.6 C)     Temp Source 02/21/21 1721 Oral     SpO2 02/21/21 1721 98 %     Weight 02/21/21 1722 140 lb (63.5 kg)     Height 02/21/21 1722 4\' 11"  (1.499 m)     Head Circumference --      Peak Flow --      Pain Score 02/21/21 1721 6     Pain Loc --      Pain Edu? --  Excl. in GC? --    No data found.  Updated Vital Signs BP 134/86 (BP Location: Left Arm)    Pulse 69    Temp 97.8 F (36.6 C) (Oral)    Resp 20    Ht 4\' 11"  (1.499 m)    Wt 140 lb (63.5 kg)    SpO2 98%    BMI 28.28 kg/m   Visual Acuity Right Eye Distance:   Left Eye Distance:   Bilateral Distance:    Right Eye Near:   Left Eye Near:    Bilateral Near:     Physical Exam Constitutional:      General: She is not in acute distress.    Appearance: Normal appearance. She is not toxic-appearing or diaphoretic.  HENT:     Head: Normocephalic and atraumatic.  Eyes:     Extraocular Movements: Extraocular movements intact.     Conjunctiva/sclera: Conjunctivae normal.  Pulmonary:     Effort: Pulmonary effort is normal.  Musculoskeletal:     Right elbow: No swelling. Normal range of motion. Tenderness present.     Left elbow: Normal.     Comments: Tenderness to palpation distal to olecranon that wraps around to posterior forearm.  Patient has full range of motion of  elbow.  Able to extend, flex, supinate, pronate.  Grip strength 5/5.  Neurovascular intact.  Neurological:     General: No focal deficit present.     Mental Status: She is alert and oriented to person, place, and time. Mental status is at baseline.  Psychiatric:        Mood and Affect: Mood normal.        Behavior: Behavior normal.        Thought Content: Thought content normal.        Judgment: Judgment normal.     UC Treatments / Results  Labs (all labs ordered are listed, but only abnormal results are displayed) Labs Reviewed - No data to display  EKG   Radiology No results found.  Procedures Procedures (including critical care time)  Medications Ordered in UC Medications - No data to display  Initial Impression / Assessment and Plan / UC Course  I have reviewed the triage vital signs and the nursing notes.  Pertinent labs & imaging results that were available during my care of the patient were reviewed by me and considered in my medical decision making (see chart for details).     Suspect muscular or tendon injury.  No suspicion for bony abnormality so do not think imaging is necessary.  Discussed supportive care and ice application.  Patient to follow-up with orthopedist for further evaluation and management.  Patient verbalized understanding and was agreeable with plan. Final Clinical Impressions(s) / UC Diagnoses   Final diagnoses:  Right elbow pain     Discharge Instructions      It appears that you have a muscular injury.  Please use ice application.  Follow-up with orthopedist for further evaluation and management.    ED Prescriptions   None    PDMP not reviewed this encounter.   Teodora Medici, [place] 02/21/21 1731

## 2021-02-22 ENCOUNTER — Ambulatory Visit: Payer: Self-pay

## 2021-07-12 ENCOUNTER — Ambulatory Visit
Admission: EM | Admit: 2021-07-12 | Discharge: 2021-07-12 | Disposition: A | Discharge status: Home / Self Care | Payer: BC Managed Care – PPO | Attending: Internal Medicine | Admitting: Internal Medicine

## 2021-07-12 DIAGNOSIS — B354 Tinea corporis: Secondary | ICD-10-CM | POA: Diagnosis not present

## 2021-07-12 DIAGNOSIS — S83401A Sprain of unspecified collateral ligament of right knee, initial encounter: Secondary | ICD-10-CM | POA: Diagnosis not present

## 2021-07-12 MED ORDER — CLOTRIMAZOLE-BETAMETHASONE 1-0.05 % EX CREA: TOPICAL_CREAM | CUTANEOUS | 0 refills | Status: DC

## 2021-07-12 MED ORDER — NAPROXEN 375 MG PO TABS: 375.0000 mg | ORAL_TABLET | Freq: Two times a day (BID) | ORAL | 0 refills | Status: DC

## 2021-07-12 NOTE — ED Triage Notes (Signed)
Pt c/o right knee pain and swelling. Describes pain to distal medial patellar area radiating under the distal knee cap laterally. Onset ~ yesterday. Denies direct trauma/injury but states works for [employer] and deals a lot with large boxes.

## 2021-07-12 NOTE — Discharge Instructions (Addendum)
Please wear knee brace/sleeve at work.  Rest and elevate the right leg   Apply cold compresses intermittently as needed.   As pain recedes, begin normal activities slowly as tolerated.   Return to urgent care if symptoms persist.

## 2021-07-12 NOTE — ED Notes (Signed)
Pt continue to show a circular rash to posterior right hand

## 2021-07-16 NOTE — ED Provider Notes (Signed)
[facility]    CSN: [id number] Arrival date & time: 07/12/21  1526      History   Chief Complaint Chief Complaint  Patient presents with   right knee pain   right hand rash    HPI Norma Morris is a 26 y.o. female who works with [employer] comes to the urgent care with right knee pain of 1 day duration.  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  She denies any falling.  Pain is throbbing involving the right knee.  No swelling of the right knee.  Aggravated by movement.  No known relieving factors.  Patient is able to bear weight on the right knee.  No erythema of the right knee  Patient also complains of itchy rash on the dorsum of the right hand.  No erythema.  No pain.  No discharge.   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      Home Medications    Prior to Admission medications   Medication Sig Start Date End Date Taking? Authorizing Provider  clotrimazole-betamethasone (LOTRISONE) cream Apply to affected area 2 times daily until rash resolves 07/12/21  Yes Lamerle Jabs, Britta Mccreedy, MD  naproxen (NAPROSYN) 375 MG tablet Take 1 tablet (375 mg total) by mouth 2 (two) times daily. 07/12/21  Yes Cintya Daughety, Britta Mccreedy, MD  albuterol (VENTOLIN HFA) 108 (90 Base) MCG/ACT inhaler Inhale 1-2 puffs into the lungs every 6 (six) hours as needed for wheezing or shortness of breath. 12/09/20   Gustavus Bryant, FNP  budesonide-formoterol (SYMBICORT) 160-4.5 MCG/ACT inhaler Inhale 2 puffs into the lungs 2 (two) times daily. 08/03/17   Georgetta Haber, NP  Cetirizine HCl 10 MG CAPS Take 1 capsule (10 mg total) by mouth daily for 10  days. 03/01/20 03/11/20  Wieters, Hallie C, PA-C  etonogestrel (NEXPLANON) 68 MG IMPL implant 1 each by Subdermal route once.    [provider]    Family History Family History  Problem Relation Age of Onset   Asthma Mother    Heart disease Maternal Grandmother     Social History Social History   Tobacco Use   Smoking status: Never   Smokeless tobacco: Never  Substance Use Topics   Alcohol use: Yes    Comment: occ   Drug use: No     Allergies   Asa [aspirin], Shellfish allergy, and Flexeril [cyclobenzaprine]   Review of Systems Review of Systems  Respiratory: Negative.    Cardiovascular: Negative.   Gastrointestinal: Negative.   Genitourinary: Negative.   Musculoskeletal:  Positive for arthralgias. Negative for gait problem, joint swelling and myalgias.  Skin:  Positive for rash. Negative for color change and wound.  Neurological: Negative.      Physical Exam Triage Vital Signs ED Triage Vitals [07/12/21 1554]  Enc Vitals Group     BP (!) 150/80     Pulse Rate 70     Resp 18     Temp 98 F (36.7 C)     Temp Source Oral  SpO2 98 %     Weight      Height      Head Circumference      Peak Flow      Pain Score 0     Pain Loc      Pain Edu?      Excl. in GC?    No data found.  Updated Vital Signs BP (!) 150/80 (BP Location: Left Arm)   Pulse 70   Temp 98 F (36.7 C) (Oral)   Resp 18   SpO2 98%   Visual Acuity Right Eye Distance:   Left Eye Distance:   Bilateral Distance:    Right Eye Near:   Left Eye Near:    Bilateral Near:     Physical Exam Vitals and nursing note reviewed.  Constitutional:      General: She is not in acute distress.    Appearance: She is not ill-appearing.  Cardiovascular:     Rate and Rhythm: Normal rate and regular rhythm.  Musculoskeletal:        General: Tenderness present. No swelling, deformity or signs of injury. Normal range of motion.  Skin:    General: Skin is warm.     Findings: Rash present.      Comments: Well-circumscribed circular rash on the dorsum of the right hand.  Area involved measures at least 1 inch in the longest diameter.  There is some central clearing.  Left hand has about a quarter of an inch of a similar rash.  Neurological:     Mental Status: She is alert.      UC Treatments / Results  Labs (all labs ordered are listed, but only abnormal results are displayed) Labs Reviewed - No data to display  EKG   Radiology No results found.  Procedures Procedures (including critical care time)  Medications Ordered in UC Medications - No data to display  Initial Impression / Assessment and Plan / UC Course  I have reviewed the triage vital signs and the nursing notes.  Pertinent labs & imaging results that were available during my care of the patient were reviewed by me and considered in my medical decision making (see chart for details).     1.  Right knee sprain: Right knee brace Icing of the right knee Gentle range of motion exercises Naproxen 375 mg twice daily as needed for pain Return to urgent care if symptoms worsen No imaging indicated at this time.  2.  Tinea corporis: Clotrimazole-betamethasone cream to be applied twice daily until rash resolves Return precautions given.  Final Clinical Impressions(s) / UC Diagnoses   Final diagnoses:  Sprain of collateral ligament of right knee, initial encounter  Tinea corporis     Discharge Instructions      Please wear knee brace/sleeve at work.  Rest and elevate the right leg   Apply cold compresses intermittently as needed.   As pain recedes, begin normal activities slowly as tolerated.   Return to urgent care if symptoms persist.    ED Prescriptions     Medication Sig Dispense Auth. Provider   naproxen (NAPROSYN) 375 MG tablet Take 1 tablet (375 mg total) by mouth 2 (two) times daily. 30 tablet Tniyah Nakagawa, Britta Mccreedy, MD   clotrimazole-betamethasone (LOTRISONE) cream Apply to affected  area 2 times daily until rash resolves 15 g Mael Delap, Britta Mccreedy, MD      PDMP not reviewed this encounter.   Merrilee Jansky, MD 07/16/21 [phone]

## 2021-08-31 ENCOUNTER — Other Ambulatory Visit: Payer: Self-pay

## 2021-08-31 ENCOUNTER — Emergency Department (HOSPITAL_COMMUNITY)
Admission: EM | Admit: 2021-08-31 | Discharge: 2021-08-31 | Disposition: A | Discharge status: Home / Self Care | Payer: BC Managed Care – PPO | Attending: Emergency Medicine | Admitting: Emergency Medicine

## 2021-08-31 ENCOUNTER — Emergency Department (HOSPITAL_COMMUNITY): Payer: BC Managed Care – PPO

## 2021-08-31 DIAGNOSIS — R202 Paresthesia of skin: Secondary | ICD-10-CM | POA: Insufficient documentation

## 2021-08-31 DIAGNOSIS — R200 Anesthesia of skin: Secondary | ICD-10-CM | POA: Diagnosis present

## 2021-08-31 LAB — DIFFERENTIAL
Abs Immature Granulocytes: 0.01 10*3/uL (ref 0.00–0.07)
Basophils Absolute: 0 10*3/uL (ref 0.0–0.1)
Basophils Relative: 1 %
Eosinophils Absolute: 0.1 10*3/uL (ref 0.0–0.5)
Eosinophils Relative: 1 %
Immature Granulocytes: 0 %
Lymphocytes Relative: 30 %
Lymphs Abs: 1.5 10*3/uL (ref 0.7–4.0)
Monocytes Absolute: 0.4 10*3/uL (ref 0.1–1.0)
Monocytes Relative: 8 %
Neutro Abs: 3 10*3/uL (ref 1.7–7.7)
Neutrophils Relative %: 60 %

## 2021-08-31 LAB — CBC
HCT: 40.6 % (ref 36.0–46.0)
Hemoglobin: 13.3 g/dL (ref 12.0–15.0)
MCH: 28.1 pg (ref 26.0–34.0)
MCHC: 32.8 g/dL (ref 30.0–36.0)
MCV: 85.8 fL (ref 80.0–100.0)
Platelets: 228 10*3/uL (ref 150–400)
RBC: 4.73 MIL/uL (ref 3.87–5.11)
RDW: 12.1 % (ref 11.5–15.5)
WBC: 5 10*3/uL (ref 4.0–10.5)
nRBC: 0 % (ref 0.0–0.2)

## 2021-08-31 LAB — ETHANOL: Alcohol, Ethyl (B): <10 mg/dL (ref <10)

## 2021-08-31 LAB — I-STAT CHEM 8, ED
BUN: 17 mg/dL (ref 6–20)
Calcium, Ion: 1.18 mmol/L (ref 1.15–1.40)
Chloride: 104 mmol/L (ref 98–111)
Creatinine, Ser: 0.7 mg/dL (ref 0.44–1.00)
Glucose, Bld: 90 mg/dL (ref 70–99)
HCT: 42 % (ref 36.0–46.0)
Hemoglobin: 14.3 g/dL (ref 12.0–15.0)
Potassium: 4.2 mmol/L (ref 3.5–5.1)
Sodium: 138 mmol/L (ref 135–145)
TCO2: 25 mmol/L (ref 22–32)

## 2021-08-31 LAB — COMPREHENSIVE METABOLIC PANEL
ALT: 14 U/L (ref 0–44)
AST: 15 U/L (ref 15–41)
Albumin: 4.4 g/dL (ref 3.5–5.0)
Alkaline Phosphatase: 65 U/L (ref 38–126)
Anion gap: 7 (ref 5–15)
BUN: 14 mg/dL (ref 6–20)
CO2: 23 mmol/L (ref 22–32)
Calcium: 9.4 mg/dL (ref 8.9–10.3)
Chloride: 107 mmol/L (ref 98–111)
Creatinine, Ser: 0.81 mg/dL (ref 0.44–1.00)
GFR, Estimated: >60 mL/min (ref >60)
Glucose, Bld: 94 mg/dL (ref 70–99)
Potassium: 4.1 mmol/L (ref 3.5–5.1)
Sodium: 137 mmol/L (ref 135–145)
Total Bilirubin: 0.7 mg/dL (ref 0.3–1.2)
Total Protein: 7 g/dL (ref 6.5–8.1)

## 2021-08-31 LAB — I-STAT BETA HCG BLOOD, ED (MC, WL, AP ONLY): I-stat hCG, quantitative: <5 m[IU]/mL (ref <5)

## 2021-08-31 MED ORDER — SODIUM CHLORIDE 0.9% FLUSH: 3.0000 mL | Freq: Once | INTRAVENOUS | Status: DC

## 2021-08-31 NOTE — ED Provider Triage Note (Signed)
Emergency Medicine Provider Triage Evaluation Note  FIDENCIA MCCLOUD , a 26 y.o. female  was evaluated in triage.  Pt complains of numbness to her right side.  She states that she woke up feeling this way around 10:00 this morning.  She states that she has had numbness primarily to her right arm but also feels it in her right thigh and toes.  She denies having any weakness, headache or visual changes.  She states that this is happened once previously about 3 years ago but is not recurred until now.  She denies any recent head or neck trauma.  Denies slurred speech.  Review of Systems  Positive: See above Negative:   Physical Exam  BP (!) 146/78 (BP Location: Right Arm)   Pulse 71   Temp 98.4 F (36.9 C) (Oral)   Resp 20   SpO2 100%  Gen:   Awake, no distress   Resp:  Normal effort  MSK:   Moves extremities without difficulty  Other:  Focal neurological deficits.  Cranial nerves II through XII are intact.  No pronator drift.  Strength equal in bilateral upper and lower extremities.  Has subjective sensation change to the right arm, face and right leg.  Medical Decision Making  Medically screening exam initiated at 12:55 PM.  Appropriate orders placed.  RAUL WINTERHALTER was informed that the remainder of the evaluation will be completed by another provider, this initial triage assessment does not replace that evaluation, and the importance of remaining in the ED until their evaluation is complete.     Cristopher Peru, PA-C 08/31/21 1258

## 2021-08-31 NOTE — Discharge Instructions (Signed)
Your work-up in the ER today was reassuring for acute abnormalities.  I have given your referral to see for neurology with a number to call schedule appointment for continued evaluation and management of your symptoms as needed.  I do strongly recommend that you call your OB/GYN on Monday as well.  Return if development of any new or worsening symptoms.

## 2021-08-31 NOTE — ED Triage Notes (Signed)
Patient presents today w/ right sided numbness to the arm and R leg at 10:56 this early this morning. Patient states that she does have a hx of migraines and has had them intermittently over the last couple of days. In the past patient has had R sided numbness to the arm with her migraine however today she has not had one. Patient reports recent change to birth control as she had the nexplanon placed end of July and has had heavy bleeding with it so her doctor prescribed her estradiol which she started taking yesterday. No other neurological deficits.

## 2021-08-31 NOTE — ED Provider Notes (Signed)
[facility] EMERGENCY DEPARTMENT Provider Note   CSN: [id number] Arrival date & time: 08/31/21  1150     History  No chief complaint on file.   Norma Morris is a 26 y.o. female.  Patient with history of asthma and hypertension presents today with complaints of right-sided numbness.  She states that same has been present should she woke up at 10 AM this morning.  She states that when she went to bed around 11 PM last night she was asymptomatic.  She states that when she woke up this morning she noticed that her right forearm was numb.  States that throughout the day her symptoms progressed and she felt numb on the right side of her face, throughout her right arm, and in her right leg as well.  She states that she did not feel weak at all and was able to ambulate normally.  She denied any headaches, lightheadedness, dizziness, vision changes.  States that she had an episode similar to this in 2019 and presented here for same where she had an MRI of her brain that was normal.  She states that her symptoms spontaneously resolved and have not recurred since then.  She did not seek follow-up for this episode.  She does state that she has been under more stress recently and just had a longer menstrual cycle than normal and in order to get her bleeding to stop her OB/GYN put her on OCPs she started 3 days ago.  She is concerned that the OCPs could have caused the symptoms.  She denies any pelvic pain and states that her bleeding stopped right after she started the OCPs. Denies fevers, chills, chest pain, shortness of breath, nausea, vomiting, or diarrhea.  The history is provided by the patient. No language interpreter was used.       Home Medications Prior to Admission medications   Medication Sig Start Date End Date Taking? Authorizing Provider  albuterol (VENTOLIN HFA) 108 (90 Base) MCG/ACT inhaler Inhale 1-2 puffs into the lungs every 6 (six) hours as needed for wheezing or  shortness of breath. 12/09/20   Gustavus Bryant, FNP  budesonide-formoterol (SYMBICORT) 160-4.5 MCG/ACT inhaler Inhale 2 puffs into the lungs 2 (two) times daily. 08/03/17   Georgetta Haber, NP  Cetirizine HCl 10 MG CAPS Take 1 capsule (10 mg total) by mouth daily for 10 days. 03/01/20 03/11/20  Wieters, Hallie C, PA-C  clotrimazole-betamethasone (LOTRISONE) cream Apply to affected area 2 times daily until rash resolves 07/12/21   Lamptey, Britta Mccreedy, MD  etonogestrel (NEXPLANON) 68 MG IMPL implant 1 each by Subdermal route once.    [provider]  naproxen (NAPROSYN) 375 MG tablet Take 1 tablet (375 mg total) by mouth 2 (two) times daily. 07/12/21   Lamptey, Britta Mccreedy, MD      Allergies    Asa [aspirin], Shellfish allergy, and Flexeril [cyclobenzaprine]    Review of Systems   Review of Systems  Neurological:  Positive for numbness. Negative for dizziness, tremors, seizures, syncope, facial asymmetry, speech difficulty, weakness, light-headedness and headaches.  All other systems reviewed and are negative.   Physical Exam Updated Vital Signs BP 132/81   Pulse 88   Temp 98.3 F (36.8 C) (Oral)   Resp 16   SpO2 99%  Physical Exam Vitals and nursing note reviewed.  Constitutional:      General: She is not in acute distress.    Appearance: Normal appearance. She is normal weight. She is  not ill-appearing, toxic-appearing or diaphoretic.  HENT:     Head: Normocephalic and atraumatic.  Eyes:     Extraocular Movements: Extraocular movements intact.     Pupils: Pupils are equal, round, and reactive to light.  Cardiovascular:     Rate and Rhythm: Normal rate and regular rhythm.     Pulses:          Radial pulses are 2+ on the right side and 2+ on the left side.       Dorsalis pedis pulses are 2+ on the right side and 2+ on the left side.       Posterior tibial pulses are 2+ on the right side and 2+ on the left side.     Heart sounds: Normal heart sounds.  Pulmonary:     Effort:  Pulmonary effort is normal. No respiratory distress.     Breath sounds: Normal breath sounds.  Abdominal:     General: Abdomen is flat.     Palpations: Abdomen is soft.  Musculoskeletal:        General: Normal range of motion.     Cervical back: Normal range of motion.     Right lower leg: No edema.     Left lower leg: No edema.  Skin:    General: Skin is warm and dry.  Neurological:     General: No focal deficit present.     Mental Status: She is alert and oriented to person, place, and time.     GCS: GCS eye subscore is 4. GCS verbal subscore is 5. GCS motor subscore is 6.     Sensory: Sensation is intact.     Motor: Motor function is intact.     Coordination: Coordination is intact.     Gait: Gait is intact.     Comments: Alert and oriented to self, place, time and event.    Speech is fluent, clear without dysarthria or dysphasia.    Strength 5/5 in upper/lower extremities   Sensation subjectively different in the patients right forearm, intact everywhere else in upper/lower extremities as well as the face.   CN I not tested  CN II grossly intact visual fields bilaterally. Did not visualize posterior eye.  CN III, IV, VI PERRLA and EOMs intact bilaterally  CN V Intact sensation to sharp and light touch to the face  CN VII facial movements symmetric  CN VIII not tested  CN IX, X no uvula deviation, symmetric rise of soft palate  CN XI 5/5 SCM and trapezius strength bilaterally  CN XII Midline tongue protrusion, symmetric L/R movements   Psychiatric:        Mood and Affect: Mood normal.        Behavior: Behavior normal.     ED Results / Procedures / Treatments   Labs (all labs ordered are listed, but only abnormal results are displayed) Labs Reviewed  CBC  DIFFERENTIAL  COMPREHENSIVE METABOLIC PANEL  ETHANOL  I-STAT CHEM 8, ED  I-STAT BETA HCG BLOOD, ED ([facility], [facility], [facility] ONLY)    EKG EKG Interpretation  Date/Time:  Saturday August 31 2021 17:23:50 EDT Ventricular  Rate:  84 PR Interval:  128 QRS Duration: 86 QT Interval:  358 QTC Calculation: 423 R Axis:   83 Text Interpretation: Normal sinus rhythm nonspecific T waves unchanged when compared to 2019 Confirmed by Pricilla Loveless [phone]) on 08/31/2021 5:30:29 PM  Radiology MR BRAIN WO CONTRAST  Result Date: 08/31/2021 CLINICAL DATA:  Multiple sclerosis. Numbness right side of  face. Patient states she also has numbness in the right upper and lower extremity. EXAM: MRI HEAD WITHOUT CONTRAST TECHNIQUE: Multiplanar, multiecho pulse sequences of the brain and surrounding structures were obtained without intravenous contrast. COMPARISON:  MR head without contrast 11/24/2017 FINDINGS: Brain: No acute infarct, hemorrhage, or mass lesion is present. No significant white matter lesions are present. The ventricles are of normal size. No significant extraaxial fluid collection is present. The internal auditory canals are within normal limits. The brainstem and cerebellum are within normal limits. Vascular: Flow is present in the major intracranial arteries. Skull and upper cervical spine: The craniocervical junction is normal. Upper cervical spine is within normal limits. Marrow signal is unremarkable. Sinuses/Orbits: The paranasal sinuses and mastoid air cells are clear. The globes and orbits are within normal limits. IMPRESSION: Negative MRI of the brain. No acute or focal lesion to explain the patient's symptoms. Electronically Signed   By: Marin Roberts M.D.   On: 08/31/2021 16:16    Procedures Procedures    Medications Ordered in ED Medications  sodium chloride flush (NS) 0.9 % injection 3 mL (has no administration in time range)    ED Course/ Medical Decision Making/ A&P                           Medical Decision Making Amount and/or Complexity of Data Reviewed Labs: ordered.   This patient presents to the ED for concern of right sided numbness, this involves an extensive number of treatment  options, and is a complaint that carries with it a high risk of complications and morbidity.   Co morbidities that complicate the patient evaluation  Hx migraines   Additional history obtained:  Additional history obtained from epic chart review   Lab Tests:  I Ordered, and personally interpreted labs.  The pertinent results include:  no acute laboratory findings   Imaging Studies ordered:  I ordered imaging studies including MRI brain   I independently visualized and interpreted imaging which showed NAD I agree with the radiologist interpretation   Cardiac Monitoring: / EKG:  The patient was maintained on a cardiac monitor.  I personally viewed and interpreted the cardiac monitored which showed an underlying rhythm of: NSR   Consultations Obtained:  I requested consultation with the Dr. Jerrell Belfast with neurology,  and discussed lab and imaging findings as well as pertinent plan - they recommend: MRI brain for evaluation, if normal no further interventions   Test / Admission - Considered:  Patient presents today with complaints of right-sided numbness.  She is afebrile, nontoxic-appearing, and in no acute distress with reassuring vital signs.  She is also alert and oriented and neurologically intact without focal deficits other than subjective numbness isolated to her right forearm.  She also has normal laboratory evaluation and MRI study.  She is also a low risk age category without any comorbid factors.  Very low suspicion for CVA/TIA/neoplasm, ACS, or any other emergent concerns related to her condition at this time.  Upon my assessment after patient waited in the lobby for about 5 hours, she states that her symptoms have significantly improved and her numbness is now only present on her right forearm.  Her hand and upper arm have normal sensation.  She also has no weakness or any other focal neurologic deficits, very low suspicion for demyelinating or autoimmune conditions such  as GBS.  Suspect this could be a stress response given her recent life stressors or complex  migraine.  She is stable for discharge, educated on red flag symptoms that would prompt immediate return.  We will give neurology referral for continued evaluation.  We will also recommend close [facility] care follow-up as well as further discussion with her OB/GYN about her OCPs.  Patient is understanding and amenable with plan, discharged in stable condition.  Findings and plan of care discussed with supervising physician Dr. Criss Alvine who is in agreement.    Final Clinical Impression(s) / ED Diagnoses Final diagnoses:  Paresthesias    Rx / DC Orders ED Discharge Orders     None     An After Visit Summary was printed and given to the patient.     Vear Clock 08/31/21 Trinna Balloon, MD 09/01/21 [phone]

## 2021-11-30 ENCOUNTER — Encounter: Payer: Self-pay | Admitting: Emergency Medicine

## 2021-11-30 ENCOUNTER — Other Ambulatory Visit: Payer: Self-pay

## 2021-11-30 ENCOUNTER — Ambulatory Visit
Admission: EM | Admit: 2021-11-30 | Discharge: 2021-11-30 | Disposition: A | Discharge status: Home / Self Care | Payer: BC Managed Care – PPO | Attending: Physician Assistant | Admitting: Physician Assistant

## 2021-11-30 DIAGNOSIS — J4521 Mild intermittent asthma with (acute) exacerbation: Secondary | ICD-10-CM | POA: Diagnosis not present

## 2021-11-30 MED ORDER — PREDNISONE 20 MG PO TABS: 40.0000 mg | ORAL_TABLET | Freq: Every day | ORAL | 0 refills | Status: AC

## 2021-11-30 NOTE — ED Triage Notes (Signed)
Pt here for URI sx with cough and some asthma sx x 4 days

## 2021-11-30 NOTE — ED Provider Notes (Signed)
[facility]    CSN: [id number] Arrival date & time: 11/30/21  1422      History   Chief Complaint Chief Complaint  Patient presents with   Cough    HPI Norma Morris is a 26 y.o. female.   Patient here today for relation of upper respiratory symptoms that started about 4 days ago.  She states that have exacerbated her asthma and she has had more tightness in her chest.  She does report some shortness of breath as well.  She has had wheezing and has used her inhaler with mild temporary relief.  She denies any fever.  She has not had any vomiting or diarrhea.   The history is provided by the patient.    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      Home Medications    Prior to Admission medications   Medication Sig Start Date End Date Taking? Authorizing Provider  predniSONE (DELTASONE) 20 MG tablet Take 2 tablets (40 mg total) by mouth daily with breakfast for 5 days. 11/30/21 12/05/21 Yes Tomi Bamberger, PA-C  albuterol (VENTOLIN HFA) 108 (90 Base) MCG/ACT inhaler Inhale 1-2 puffs into the lungs every 6 (six) hours as needed for wheezing or shortness of breath. 12/09/20   Gustavus Bryant, FNP  budesonide-formoterol (SYMBICORT) 160-4.5 MCG/ACT inhaler Inhale 2 puffs into the lungs 2 (two) times daily. 08/03/17   Georgetta Haber, NP  Cetirizine HCl 10 MG CAPS Take 1 capsule (10 mg total) by mouth daily for 10 days. 03/01/20 03/11/20  Wieters, Hallie C, PA-C  clotrimazole-betamethasone (LOTRISONE) cream Apply to affected area 2 times daily until rash resolves 07/12/21   Lamptey, Britta Mccreedy, MD  etonogestrel (NEXPLANON) 68 MG IMPL implant 1  each by Subdermal route once.    [provider]  naproxen (NAPROSYN) 375 MG tablet Take 1 tablet (375 mg total) by mouth 2 (two) times daily. 07/12/21   Lamptey, Britta Mccreedy, MD    Family History Family History  Problem Relation Age of Onset   Asthma Mother    Heart disease Maternal Grandmother     Social History Social History   Tobacco Use   Smoking status: Never   Smokeless tobacco: Never  Substance Use Topics   Alcohol use: Yes    Comment: occ   Drug use: No     Allergies   Asa [aspirin], Shellfish allergy, and Flexeril [cyclobenzaprine]   Review of Systems Review of Systems  Constitutional:  Negative for chills and fever.  HENT:  Positive for congestion. Negative for ear pain, sinus pressure and sore throat.   Eyes:  Negative for discharge and redness.  Respiratory:  Positive for cough, shortness of breath and wheezing.   Gastrointestinal:  Negative for abdominal pain, diarrhea, nausea and vomiting.     Physical Exam Triage Vital Signs ED Triage Vitals  Enc Vitals Group     BP      Pulse      Resp      Temp      Temp src  SpO2      Weight      Height      Head Circumference      Peak Flow      Pain Score      Pain Loc      Pain Edu?      Excl. in GC?    No data found.  Updated Vital Signs BP 133/83 (BP Location: Left Arm)   Pulse 89   Temp 97.9 F (36.6 C) (Oral)   Resp 18   SpO2 99%     Physical Exam Vitals and nursing note reviewed.  Constitutional:      General: She is not in acute distress.    Appearance: Normal appearance. She is not ill-appearing.  HENT:     Head: Normocephalic and atraumatic.     Nose: Congestion present.     Mouth/Throat:     Mouth: Mucous membranes are moist.     Pharynx: No oropharyngeal exudate or posterior oropharyngeal erythema.  Eyes:     Conjunctiva/sclera: Conjunctivae normal.  Cardiovascular:     Rate and Rhythm: Normal rate and regular rhythm.     Heart sounds: Normal heart sounds. No  murmur heard. Pulmonary:     Effort: Pulmonary effort is normal. No respiratory distress.     Breath sounds: Normal breath sounds. No wheezing, rhonchi or rales.  Skin:    General: Skin is warm and dry.  Neurological:     Mental Status: She is alert.  Psychiatric:        Mood and Affect: Mood normal.        Thought Content: Thought content normal.      UC Treatments / Results  Labs (all labs ordered are listed, but only abnormal results are displayed) Labs Reviewed - No data to display  EKG   Radiology No results found.  Procedures Procedures (including critical care time)  Medications Ordered in UC Medications - No data to display  Initial Impression / Assessment and Plan / UC Course  I have reviewed the triage vital signs and the nursing notes.  Pertinent labs & imaging results that were available during my care of the patient were reviewed by me and considered in my medical decision making (see chart for details).    Will treat with prednisone to cover exacerbation.  Discussed screening for viral illness however given duration of symptoms no added benefit at this time.  Encouraged follow-up if symptoms fail to improve or worsen anyway.  Final Clinical Impressions(s) / UC Diagnoses   Final diagnoses:  Mild intermittent asthma with acute exacerbation   Discharge Instructions   None    ED Prescriptions     Medication Sig Dispense Auth. Provider   predniSONE (DELTASONE) 20 MG tablet Take 2 tablets (40 mg total) by mouth daily with breakfast for 5 days. 10 tablet Tomi Bamberger, PA-C      PDMP not reviewed this encounter.   Tomi Bamberger, PA-C 11/30/21 1557

## 2021-12-06 DIAGNOSIS — Z419 Encounter for procedure for purposes other than remedying health state, unspecified: Secondary | ICD-10-CM | POA: Diagnosis not present

## 2021-12-07 ENCOUNTER — Telehealth: Payer: BC Managed Care – PPO | Admitting: Nurse Practitioner

## 2021-12-07 DIAGNOSIS — J4541 Moderate persistent asthma with (acute) exacerbation: Secondary | ICD-10-CM

## 2021-12-07 MED ORDER — PROMETHAZINE-DM 6.25-15 MG/5ML PO SYRP: 5.0000 mL | ORAL_SOLUTION | Freq: Four times a day (QID) | ORAL | 0 refills | Status: DC | PRN

## 2021-12-07 MED ORDER — AZITHROMYCIN 250 MG PO TABS: ORAL_TABLET | ORAL | 0 refills | Status: DC

## 2021-12-07 NOTE — Progress Notes (Signed)
Virtual Visit Consent   Norma Morris, you are scheduled for a virtual visit with Mary-Margaret Daphine Deutscher, FNP, a [facility] provider, today.     Just as with appointments in the office, your consent must be obtained to participate.  Your consent will be active for this visit and any virtual visit you may have with one of our providers in the next 365 days.     If you have a MyChart account, a copy of this consent can be sent to you electronically.  All virtual visits are billed to your insurance company just like a traditional visit in the office.    As this is a virtual visit, video technology does not allow for your provider to perform a traditional examination.  This may limit your provider's ability to fully assess your condition.  If your provider identifies any concerns that need to be evaluated in person or the need to arrange testing (such as labs, EKG, etc.), we will make arrangements to do so.     Although advances in technology are sophisticated, we cannot ensure that it will always work on either your end or our end.  If the connection with a video visit is poor, the visit may have to be switched to a telephone visit.  With either a video or telephone visit, we are not always able to ensure that we have a secure connection.     I need to obtain your verbal consent now.   Are you willing to proceed with your visit today? YES   Norma Morris has provided verbal consent on 12/07/2021 for a virtual visit (video or telephone).   Mary-Margaret Daphine Deutscher, FNP   Date: 12/07/2021 6:12 PM   Virtual Visit via Video Note   I, Mary-Margaret Daphine Deutscher, connected with Norma Morris ([id number], 08/10/1995) on 12/07/21 at  6:30 PM EST by a video-enabled telemedicine application and verified that I am speaking with the correct person using two identifiers.  Location: Patient: Virtual Visit Location Patient: Home Provider: Virtual Visit Location Provider: Mobile   I discussed the limitations  of evaluation and management by telemedicine and the availability of in person appointments. The patient expressed understanding and agreed to proceed.    History of Present Illness: Norma Morris is a 26 y.o. who identifies as a female who was assigned female at birth, and is being seen today for uri.  HPI: Patient was seen over a week ago with URI symptoms. Was given steroids. No antibiotics.   URI  This is a new problem. The current episode started in the past 7 days. The problem has been gradually improving. There has been no fever. Associated symptoms include congestion and coughing. Pertinent negatives include no headaches or sore throat. Treatments tried: steroids. The treatment provided mild relief.    Review of Systems  HENT:  Positive for congestion. Negative for sore throat.   Respiratory:  Positive for cough.   Neurological:  Negative for headaches.    Problems:  Patient Active Problem List   Diagnosis Date Noted   Left knee pain 03/11/2011    Allergies:  Allergies  Allergen Reactions   Asa [Aspirin] Nausea Only   Shellfish Allergy Itching   Flexeril [Cyclobenzaprine] Rash   Medications:  Current Outpatient Medications:    albuterol (VENTOLIN HFA) 108 (90 Base) MCG/ACT inhaler, Inhale 1-2 puffs into the lungs every 6 (six) hours as needed for wheezing or shortness of breath., Disp: 1 each, Rfl: 0   budesonide-formoterol (SYMBICORT)  160-4.5 MCG/ACT inhaler, Inhale 2 puffs into the lungs 2 (two) times daily., Disp: 1 Inhaler, Rfl: 0   Cetirizine HCl 10 MG CAPS, Take 1 capsule (10 mg total) by mouth daily for 10 days., Disp: 10 capsule, Rfl: 0   clotrimazole-betamethasone (LOTRISONE) cream, Apply to affected area 2 times daily until rash resolves, Disp: 15 g, Rfl: 0   etonogestrel (NEXPLANON) 68 MG IMPL implant, 1 each by Subdermal route once., Disp: , Rfl:    naproxen (NAPROSYN) 375 MG tablet, Take 1 tablet (375 mg total) by mouth 2 (two) times daily., Disp: 30 tablet,  Rfl: 0  Observations/Objective: Patient is well-developed, well-nourished in no acute distress.  Resting comfortably  at home.  Head is normocephalic, atraumatic.  No labored breathing.  Speech is clear and coherent with logical content.  Patient is alert and oriented at baseline.  Auditory exp wheezes Deep cough  Assessment and Plan:  Norma Morris in today with chief complaint of URI   1. Moderate persistent asthmatic bronchitis with acute exacerbation 1. Take meds as prescribed 2. Use a cool mist humidifier especially during the winter months and when heat has been humid. 3. Use saline nose sprays frequently 4. Saline irrigations of the nose can be very helpful if done frequently.  * 4X daily for 1 week*  * Use of a nettie pot can be helpful with this. Follow directions with this* 5. Drink plenty of fluids 6. Keep thermostat turn down low 7.For any cough or congestion- promethazine DM 8. For fever or aces or pains- take tylenol or ibuprofen appropriate for age and weight.  * for fevers greater than 101 orally you may alternate ibuprofen and tylenol every  3 hours.   Albuterol as needed  Meds ordered this encounter  Medications   promethazine-dextromethorphan (PROMETHAZINE-DM) 6.25-15 MG/5ML syrup    Sig: Take 5 mLs by mouth 4 (four) times daily as needed for cough.    Dispense:  118 mL    Refill:  0    Order Specific Question:   Supervising Provider    Answer:   Merrilee Jansky [[id number]]   azithromycin (ZITHROMAX Z-PAK) 250 MG tablet    Sig: As directed    Dispense:  6 tablet    Refill:  0    Order Specific Question:   Supervising Provider    Answer:   Merrilee Jansky [[id number]]    Follow Up Instructions: I discussed the assessment and treatment plan with the patient. The patient was provided an opportunity to ask questions and all were answered. The patient agreed with the plan and demonstrated an understanding of the instructions.  A copy of instructions  were sent to the patient via MyChart.  The patient was advised to call back or seek an in-person evaluation if the symptoms worsen or if the condition fails to improve as anticipated.  Time:  I spent 8 minutes with the patient via telehealth technology discussing the above problems/concerns.    Mary-Margaret Daphine Deutscher, FNP

## 2021-12-07 NOTE — Patient Instructions (Signed)
Claudean Kinds, thank you for joining Bennie Pierini, FNP for today's virtual visit.  While this provider is not your primary care provider (PCP), if your PCP is located in our provider database this encounter information will be shared with them immediately following your visit.   A [facility] MyChart account gives you access to today's visit and all your visits, tests, and labs performed at [facility] " click here if you don't have a [facility] MyChart account or go to [email or web address]  Consent: (Patient) Norma Morris provided verbal consent for this virtual visit at the beginning of the encounter.  Current Medications:  Current Outpatient Medications:    azithromycin (ZITHROMAX Z-PAK) 250 MG tablet, As directed, Disp: 6 tablet, Rfl: 0   promethazine-dextromethorphan (PROMETHAZINE-DM) 6.25-15 MG/5ML syrup, Take 5 mLs by mouth 4 (four) times daily as needed for cough., Disp: 118 mL, Rfl: 0   albuterol (VENTOLIN HFA) 108 (90 Base) MCG/ACT inhaler, Inhale 1-2 puffs into the lungs every 6 (six) hours as needed for wheezing or shortness of breath., Disp: 1 each, Rfl: 0   budesonide-formoterol (SYMBICORT) 160-4.5 MCG/ACT inhaler, Inhale 2 puffs into the lungs 2 (two) times daily., Disp: 1 Inhaler, Rfl: 0   Cetirizine HCl 10 MG CAPS, Take 1 capsule (10 mg total) by mouth daily for 10 days., Disp: 10 capsule, Rfl: 0   clotrimazole-betamethasone (LOTRISONE) cream, Apply to affected area 2 times daily until rash resolves, Disp: 15 g, Rfl: 0   etonogestrel (NEXPLANON) 68 MG IMPL implant, 1 each by Subdermal route once., Disp: , Rfl:    naproxen (NAPROSYN) 375 MG tablet, Take 1 tablet (375 mg total) by mouth 2 (two) times daily., Disp: 30 tablet, Rfl: 0   Medications ordered in this encounter:  Meds ordered this encounter  Medications   promethazine-dextromethorphan (PROMETHAZINE-DM) 6.25-15 MG/5ML syrup    Sig: Take 5 mLs by mouth 4 (four) times daily as needed for  cough.    Dispense:  118 mL    Refill:  0    Order Specific Question:   Supervising Provider    Answer:   Merrilee Jansky [[id number]]   azithromycin (ZITHROMAX Z-PAK) 250 MG tablet    Sig: As directed    Dispense:  6 tablet    Refill:  0    Order Specific Question:   Supervising Provider    Answer:   Merrilee Jansky [[id number]]     *If you need refills on other medications prior to your next appointment, please contact your pharmacy*  Follow-Up: Call back or seek an in-person evaluation if the symptoms worsen or if the condition fails to improve as anticipated.  [facility] [phone]  Other Instructions 1. Take meds as prescribed 2. Use a cool mist humidifier especially during the winter months and when heat has been humid. 3. Use saline nose sprays frequently 4. Saline irrigations of the nose can be very helpful if done frequently.  * 4X daily for 1 week*  * Use of a nettie pot can be helpful with this. Follow directions with this* 5. Drink plenty of fluids 6. Keep thermostat turn down low 7.For any cough or congestion- promethazine DM 8. For fever or aces or pains- take tylenol or ibuprofen appropriate for age and weight.  * for fevers greater than 101 orally you may alternate ibuprofen and tylenol every  3 hours.      If you have been instructed to have an in-person evaluation today at a local  Urgent Care facility, please use the link below. It will take you to a list of all of our available [facility], including address, phone number and hours of operation. Please do not delay care.  [facility]  If you or a family member do not have a primary care provider, use the link below to schedule a visit and establish care. When you choose a [facility] primary care physician or advanced practice provider, you gain a long-term partner in health. Find a Primary Care Provider  Learn more about [facility]'s in-office and virtual care  options: [facility] Now

## 2022-01-06 DIAGNOSIS — Z419 Encounter for procedure for purposes other than remedying health state, unspecified: Secondary | ICD-10-CM | POA: Diagnosis not present

## 2022-01-09 ENCOUNTER — Telehealth: Payer: Self-pay

## 2022-01-09 NOTE — Telephone Encounter (Signed)
Sending mychart msg. AS, CMA 

## 2022-01-17 ENCOUNTER — Telehealth: Payer: BC Managed Care – PPO | Admitting: Nurse Practitioner

## 2022-01-17 ENCOUNTER — Other Ambulatory Visit: Payer: Self-pay | Admitting: Nurse Practitioner

## 2022-01-17 DIAGNOSIS — J4520 Mild intermittent asthma, uncomplicated: Secondary | ICD-10-CM | POA: Diagnosis not present

## 2022-01-17 DIAGNOSIS — U071 COVID-19: Secondary | ICD-10-CM | POA: Diagnosis not present

## 2022-01-17 MED ORDER — ALBUTEROL SULFATE HFA 108 (90 BASE) MCG/ACT IN AERS: 2.0000 | INHALATION_SPRAY | Freq: Four times a day (QID) | RESPIRATORY_TRACT | 0 refills | Status: AC | PRN

## 2022-01-17 NOTE — Progress Notes (Signed)
Virtual Visit Consent   Norma Morris, you are scheduled for a virtual visit with a [facility] provider today. Just as with appointments in the office, your consent must be obtained to participate. Your consent will be active for this visit and any virtual visit you may have with one of our providers in the next 365 days. If you have a MyChart account, a copy of this consent can be sent to you electronically.  As this is a virtual visit, video technology does not allow for your provider to perform a traditional examination. This may limit your provider's ability to fully assess your condition. If your provider identifies any concerns that need to be evaluated in person or the need to arrange testing (such as labs, EKG, etc.), we will make arrangements to do so. Although advances in technology are sophisticated, we cannot ensure that it will always work on either your end or our end. If the connection with a video visit is poor, the visit may have to be switched to a telephone visit. With either a video or telephone visit, we are not always able to ensure that we have a secure connection.  By engaging in this virtual visit, you consent to the provision of healthcare and authorize for your insurance to be billed (if applicable) for the services provided during this visit. Depending on your insurance coverage, you may receive a charge related to this service.  I need to obtain your verbal consent now. Are you willing to proceed with your visit today? Norma Morris has provided verbal consent on 01/17/2022 for a virtual visit (video or telephone). Apolonio Schneiders, FNP  Date: 01/17/2022 1:27 PM  Virtual Visit via Video Note   I, Apolonio Schneiders, connected with  Norma Morris  ([id number], 05/21/1995) on 01/17/22 at  1:30 PM EST by a video-enabled telemedicine application and verified that I am speaking with the correct person using two identifiers.  Location: Patient: Virtual Visit Location Patient:  Home Provider: Virtual Visit Location Provider: Home Office   I discussed the limitations of evaluation and management by telemedicine and the availability of in person appointments. The patient expressed understanding and agreed to proceed.    History of Present Illness: Norma Morris is a 27 y.o. who identifies as a female who was assigned female at birth, and is being seen today after testing positive for COVID-19 .  She woke up with a fever 6 days ago  Fever persisted for 2 days   Found out that everyone she lives with tested positive for COVID  She took a test yesterday and was positive   Overall she is feeling improved compared to start of illness  She has a history of asthma Uses Symbicort daily  Albuterol as needed she has not needed that over the past week   She has had COVID one time in the past 2020  Denies any complications from that round of COVID   She has been vaccinated for COVID without a recent booster    Problems:  Patient Active Problem List   Diagnosis Date Noted   Acute low back pain 04/16/2018   Migraine 02/19/2018   Asthma 11/20/2016   Left knee pain 03/11/2011    Allergies:  Allergies  Allergen Reactions   Asa [Aspirin] Nausea Only   Shellfish Allergy Itching   Flexeril [Cyclobenzaprine] Rash   Medications:  Current Outpatient Medications:    albuterol (VENTOLIN HFA) 108 (90 Base) MCG/ACT inhaler, Inhale 2 puffs into the  lungs every 6 (six) hours as needed for wheezing or shortness of breath., Disp: 8 g, Rfl: 0   albuterol (VENTOLIN HFA) 108 (90 Base) MCG/ACT inhaler, Inhale 1-2 puffs into the lungs every 6 (six) hours as needed for wheezing or shortness of breath., Disp: 1 each, Rfl: 0   budesonide-formoterol (SYMBICORT) 160-4.5 MCG/ACT inhaler, Inhale 2 puffs into the lungs 2 (two) times daily., Disp: 1 Inhaler, Rfl: 0   clotrimazole-betamethasone (LOTRISONE) cream, Apply to affected area 2 times daily until rash resolves, Disp: 15 g, Rfl:  0   naproxen (NAPROSYN) 375 MG tablet, Take 1 tablet (375 mg total) by mouth 2 (two) times daily., Disp: 30 tablet, Rfl: 0   Observations/Objective: Patient is well-developed, well-nourished in no acute distress.  Resting comfortably  at home.  Head is normocephalic, atraumatic.  No labored breathing.  Speech is clear and coherent with logical content.  Patient is alert and oriented at baseline.    Assessment and Plan: 1. Mild intermittent asthma, unspecified whether complicated  - albuterol (VENTOLIN HFA) 108 (90 Base) MCG/ACT inhaler; Inhale 2 puffs into the lungs every 6 (six) hours as needed for wheezing or shortness of breath.  Dispense: 8 g; Refill: 0  2. COVID-19 Outside of the window for anti-viral consideration.  Symptoms are improving  Continue to manage with over the counter medications as discussed      Follow Up Instructions: I discussed the assessment and treatment plan with the patient. The patient was provided an opportunity to ask questions and all were answered. The patient agreed with the plan and demonstrated an understanding of the instructions.  A copy of instructions were sent to the patient via MyChart unless otherwise noted below.    The patient was advised to call back or seek an in-person evaluation if the symptoms worsen or if the condition fails to improve as anticipated.  Time:  I spent 10 minutes with the patient via telehealth technology discussing the above problems/concerns.    Apolonio Schneiders, FNP

## 2022-02-06 DIAGNOSIS — Z419 Encounter for procedure for purposes other than remedying health state, unspecified: Secondary | ICD-10-CM | POA: Diagnosis not present

## 2022-02-24 ENCOUNTER — Telehealth: Payer: BC Managed Care – PPO | Admitting: Physician Assistant

## 2022-02-24 ENCOUNTER — Encounter: Payer: Self-pay | Admitting: Nurse Practitioner

## 2022-02-24 DIAGNOSIS — R21 Rash and other nonspecific skin eruption: Secondary | ICD-10-CM | POA: Diagnosis not present

## 2022-02-24 MED ORDER — CLOTRIMAZOLE-BETAMETHASONE 1-0.05 % EX CREA: 1.0000 | TOPICAL_CREAM | Freq: Every day | CUTANEOUS | 0 refills | Status: DC

## 2022-02-24 NOTE — Patient Instructions (Signed)
  Roxy Manns, thank you for joining [place], PA-C for today's virtual visit.  While this provider is not your primary care provider (PCP), if your PCP is located in our provider database this encounter information will be shared with them immediately following your visit.   [facility] account gives you access to today's visit and all your visits, tests, and labs performed at [facility] " click here if you don't have a [place] account or go to [email or web address]  Consent: (Patient) Norma Morris provided verbal consent for this virtual visit at the beginning of the encounter.  Current Medications:  Current Outpatient Medications:    clotrimazole-betamethasone (LOTRISONE) cream, Apply 1 Application topically daily., Disp: 30 g, Rfl: 0   albuterol (VENTOLIN HFA) 108 (90 Base) MCG/ACT inhaler, Inhale 1-2 puffs into the lungs every 6 (six) hours as needed for wheezing or shortness of breath., Disp: 1 each, Rfl: 0   albuterol (VENTOLIN HFA) 108 (90 Base) MCG/ACT inhaler, Inhale 2 puffs into the lungs every 6 (six) hours as needed for wheezing or shortness of breath., Disp: 8 g, Rfl: 0   budesonide-formoterol (SYMBICORT) 160-4.5 MCG/ACT inhaler, Inhale 2 puffs into the lungs 2 (two) times daily., Disp: 1 Inhaler, Rfl: 0   clotrimazole-betamethasone (LOTRISONE) cream, Apply to affected area 2 times daily until rash resolves, Disp: 15 g, Rfl: 0   naproxen (NAPROSYN) 375 MG tablet, Take 1 tablet (375 mg total) by mouth 2 (two) times daily., Disp: 30 tablet, Rfl: 0   Medications ordered in this encounter:  Meds ordered this encounter  Medications   clotrimazole-betamethasone (LOTRISONE) cream    Sig: Apply 1 Application topically daily.    Dispense:  30 g    Refill:  0    Order Specific Question:   Supervising Provider    Answer:   Chase Picket [id number]     *If you need refills on other medications prior to your next appointment,  please contact your pharmacy*  Follow-Up: Call back or seek an in-person evaluation if the symptoms worsen or if the condition fails to improve as anticipated.  [facility] [phone]  Other Instructions Use cream as directed.  Keep hands clean and dry. May use powder in your gloves. If no improvement follow up for in person evaluation with PCP or Urgent Care.   If you have been instructed to have an in-person evaluation today at a local Urgent Care facility, please use the link below. It will take you to a list of all of our available [facility], including address, phone number and hours of operation. Please do not delay care.  [facility]  If you or a family member do not have a primary care provider, use the link below to schedule a visit and establish care. When you choose a [facility] primary care physician or advanced practice provider, you gain a long-term partner in health. Find a Primary Care Provider  Learn more about [facility]'s in-office and virtual care options: [facility]

## 2022-02-24 NOTE — Progress Notes (Signed)
Virtual Visit Consent   Norma Morris, you are scheduled for a virtual visit with a [facility] provider today. Just as with appointments in the office, your consent must be obtained to participate. Your consent will be active for this visit and any virtual visit you may have with one of our providers in the next 365 days. If you have a MyChart account, a copy of this consent can be sent to you electronically.  As this is a virtual visit, video technology does not allow for your provider to perform a traditional examination. This may limit your provider's ability to fully assess your condition. If your provider identifies any concerns that need to be evaluated in person or the need to arrange testing (such as labs, EKG, etc.), we will make arrangements to do so. Although advances in technology are sophisticated, we cannot ensure that it will always work on either your end or our end. If the connection with a video visit is poor, the visit may have to be switched to a telephone visit. With either a video or telephone visit, we are not always able to ensure that we have a secure connection.  By engaging in this virtual visit, you consent to the provision of healthcare and authorize for your insurance to be billed (if applicable) for the services provided during this visit. Depending on your insurance coverage, you may receive a charge related to this service.  I need to obtain your verbal consent now. Are you willing to proceed with your visit today? Norma Morris has provided verbal consent on 02/24/2022 for a virtual visit (video or telephone). Lenise Arena Ward, PA-C  Date: 02/24/2022 6:07 PM  Virtual Visit via Video Note   I, Lenise Arena Ward, connected with  Norma Morris  ([id number], 12/17/1995) on 02/24/22 at  6:00 PM EST by a video-enabled telemedicine application and verified that I am speaking with the correct person using two identifiers.  Location: Patient: Virtual Visit Location  Patient: Home Provider: Virtual Visit Location Provider: Home Office   I discussed the limitations of evaluation and management by telemedicine and the availability of in person appointments. The patient expressed understanding and agreed to proceed.    History of Present Illness: Norma Morris is a 27 y.o. who identifies as a female who was assigned female at birth, and is being seen today for a rash to bilaterals arms that started several days ago.  She reports the rash is very itchy.  She was using clotrimazole with some relief, she has ran out.  Reports she wears gloves at work, has had this rash before, returned this weekend.   HPI: HPI  Problems:  Patient Active Problem List   Diagnosis Date Noted   Acute low back pain 04/16/2018   Migraine 02/19/2018   Asthma 11/20/2016   Left knee pain 03/11/2011    Allergies:  Allergies  Allergen Reactions   Asa [Aspirin] Nausea Only   Shellfish Allergy Itching   Flexeril [Cyclobenzaprine] Rash   Medications:  Current Outpatient Medications:    clotrimazole-betamethasone (LOTRISONE) cream, Apply 1 Application topically daily., Disp: 30 g, Rfl: 0   albuterol (VENTOLIN HFA) 108 (90 Base) MCG/ACT inhaler, Inhale 1-2 puffs into the lungs every 6 (six) hours as needed for wheezing or shortness of breath., Disp: 1 each, Rfl: 0   albuterol (VENTOLIN HFA) 108 (90 Base) MCG/ACT inhaler, Inhale 2 puffs into the lungs every 6 (six) hours as needed for wheezing or shortness of breath., Disp:  8 g, Rfl: 0   budesonide-formoterol (SYMBICORT) 160-4.5 MCG/ACT inhaler, Inhale 2 puffs into the lungs 2 (two) times daily., Disp: 1 Inhaler, Rfl: 0   clotrimazole-betamethasone (LOTRISONE) cream, Apply to affected area 2 times daily until rash resolves, Disp: 15 g, Rfl: 0   naproxen (NAPROSYN) 375 MG tablet, Take 1 tablet (375 mg total) by mouth 2 (two) times daily., Disp: 30 tablet, Rfl: 0  Observations/Objective: Patient is well-developed, well-nourished in  no acute distress.  Resting comfortably at home.  Head is normocephalic, atraumatic.  No labored breathing.  Speech is clear and coherent with logical content.  Patient is alert and oriented at baseline.    Assessment and Plan: 1. Rash - clotrimazole-betamethasone (LOTRISONE) cream; Apply 1 Application topically daily.  Dispense: 30 g; Refill: 0  Since the clotrimazole was providing relief will refill.  Advised in person follow up if no improvement.   Follow Up Instructions: I discussed the assessment and treatment plan with the patient. The patient was provided an opportunity to ask questions and all were answered. The patient agreed with the plan and demonstrated an understanding of the instructions.  A copy of instructions were sent to the patient via MyChart unless otherwise noted below.     The patient was advised to call back or seek an in-person evaluation if the symptoms worsen or if the condition fails to improve as anticipated.  Time:  I spent 8 minutes with the patient via telehealth technology discussing the above problems/concerns.    Lenise Arena Ward, PA-C

## 2022-02-25 MED ORDER — CLOTRIMAZOLE 1 % EX CREA: 1.0000 | TOPICAL_CREAM | Freq: Two times a day (BID) | CUTANEOUS | 0 refills | Status: DC

## 2022-02-25 NOTE — Addendum Note (Signed)
Addended by: Gracy Racer on: 02/25/2022 05:13 PM   Modules accepted: Orders

## 2022-03-07 DIAGNOSIS — Z419 Encounter for procedure for purposes other than remedying health state, unspecified: Secondary | ICD-10-CM | POA: Diagnosis not present

## 2022-03-23 ENCOUNTER — Telehealth: Payer: BC Managed Care – PPO | Admitting: Physician Assistant

## 2022-03-23 DIAGNOSIS — L301 Dyshidrosis [pompholyx]: Secondary | ICD-10-CM

## 2022-03-23 MED ORDER — CLOTRIMAZOLE 1 % EX CREA: 1.0000 | TOPICAL_CREAM | Freq: Two times a day (BID) | CUTANEOUS | 0 refills | Status: DC

## 2022-03-23 MED ORDER — TRIAMCINOLONE ACETONIDE 0.1 % EX CREA: 1.0000 | TOPICAL_CREAM | Freq: Two times a day (BID) | CUTANEOUS | 0 refills | Status: DC

## 2022-03-23 NOTE — Progress Notes (Signed)

## 2022-04-07 DIAGNOSIS — Z419 Encounter for procedure for purposes other than remedying health state, unspecified: Secondary | ICD-10-CM | POA: Diagnosis not present

## 2022-06-10 DIAGNOSIS — E663 Overweight: Secondary | ICD-10-CM | POA: Insufficient documentation

## 2022-06-10 DIAGNOSIS — J4520 Mild intermittent asthma, uncomplicated: Secondary | ICD-10-CM | POA: Insufficient documentation

## 2022-06-10 DIAGNOSIS — L739 Follicular disorder, unspecified: Secondary | ICD-10-CM | POA: Insufficient documentation

## 2022-06-14 ENCOUNTER — Telehealth: Payer: BC Managed Care – PPO | Admitting: Nurse Practitioner

## 2022-06-14 DIAGNOSIS — L739 Follicular disorder, unspecified: Secondary | ICD-10-CM | POA: Diagnosis not present

## 2022-06-14 MED ORDER — CEPHALEXIN 500 MG PO CAPS: 500.0000 mg | ORAL_CAPSULE | Freq: Three times a day (TID) | ORAL | 0 refills | Status: DC

## 2022-06-14 NOTE — Progress Notes (Signed)
Virtual Visit Consent   Norma Morris, you are scheduled for a virtual visit with Norma Daphine Deutscher, FNP, a [facility] provider, today.     Just as with appointments in the office, your consent must be obtained to participate.  Your consent will be active for this visit and any virtual visit you may have with one of our providers in the next 365 days.     If you have a MyChart account, a copy of this consent can be sent to you electronically.  All virtual visits are billed to your insurance company just like a traditional visit in the office.    As this is a virtual visit, video technology does not allow for your provider to perform a traditional examination.  This may limit your provider's ability to fully assess your condition.  If your provider identifies any concerns that need to be evaluated in person or the need to arrange testing (such as labs, EKG, etc.), we will make arrangements to do so.     Although advances in technology are sophisticated, we cannot ensure that it will always work on either your end or our end.  If the connection with a video visit is poor, the visit may have to be switched to a telephone visit.  With either a video or telephone visit, we are not always able to ensure that we have a secure connection.     I need to obtain your verbal consent now.   Are you willing to proceed with your visit today? YES   Norma Morris has provided verbal consent on 06/14/2022 for a virtual visit (video or telephone).   Norma Daphine Deutscher, FNP   Date: 06/14/2022 12:52 PM   Virtual Visit via Video Note   I, Norma Morris, connected with Norma Morris ([id number], [birth year]) on 06/14/22 at  1:00 PM EDT by a video-enabled telemedicine application and verified that I am speaking with the correct person using two identifiers.  Location: Patient: Virtual Visit Location Patient: Home Provider: Virtual Visit Location Provider: Mobile   I discussed the limitations  of evaluation and management by telemedicine and the availability of in person appointments. The patient expressed understanding and agreed to proceed.    History of Present Illness: Norma Morris is a 27 y.o. who identifies as a female who was assigned female at birth, and is being seen today for medication side effect.  HPI: Patient saw her PCP Tuesday and was dx with folliculitis and was given doxycycline. That is helping the rash but is causing nausea and vomiting. She also says she is having sever acid reflux after taking meds. Would like antibiotic changed.    Review of Systems  Constitutional: Negative.   HENT: Negative.    Gastrointestinal:  Positive for nausea and vomiting. Heartburn: decreased appetite. Skin: Negative.     Problems:  Patient Active Problem List   Diagnosis Date Noted   Acute low back pain 04/16/2018   Migraine 02/19/2018   Asthma 11/20/2016   Left knee pain 03/11/2011    Allergies:  Allergies  Allergen Reactions   Asa [Aspirin] Nausea Only   Shellfish Allergy Itching   Flexeril [Cyclobenzaprine] Rash   Medications:  Current Outpatient Medications:    albuterol (VENTOLIN HFA) 108 (90 Base) MCG/ACT inhaler, Inhale 1-2 puffs into the lungs every 6 (six) hours as needed for wheezing or shortness of breath., Disp: 1 each, Rfl: 0   albuterol (VENTOLIN HFA) 108 (90 Base) MCG/ACT inhaler, Inhale 2 puffs  into the lungs every 6 (six) hours as needed for wheezing or shortness of breath., Disp: 8 g, Rfl: 0   budesonide-formoterol (SYMBICORT) 160-4.5 MCG/ACT inhaler, Inhale 2 puffs into the lungs 2 (two) times daily., Disp: 1 Inhaler, Rfl: 0   clotrimazole (LOTRIMIN) 1 % cream, Apply 1 Application topically 2 (two) times daily., Disp: 30 g, Rfl: 0   naproxen (NAPROSYN) 375 MG tablet, Take 1 tablet (375 mg total) by mouth 2 (two) times daily., Disp: 30 tablet, Rfl: 0   triamcinolone cream (KENALOG) 0.1 %, Apply 1 Application topically 2 (two) times daily., Disp:  30 g, Rfl: 0  Observations/Objective: Patient is well-developed, well-nourished in no acute distress.  Resting comfortably  at home.  Head is normocephalic, atraumatic.  No labored breathing.  Speech is clear and coherent with logical content.  Patient is alert and oriented at baseline.  Unable to visualize rash on right hand due to video.  Assessment and Plan:  Norma Morris in today with chief complaint of No chief complaint on file.   1. Folliculitis Take meds with food Avoid rubbing or scratching rash Follow up as needed  Meds ordered this encounter  Medications   cephALEXin (KEFLEX) 500 MG capsule    Sig: Take 1 capsule (500 mg total) by mouth 3 (three) times daily.    Dispense:  21 capsule    Refill:  0    Order Specific Question:   Supervising Provider    Answer:   Merrilee Jansky [id number]      Follow Up Instructions: I discussed the assessment and treatment plan with the patient. The patient was provided an opportunity to ask questions and all were answered. The patient agreed with the plan and demonstrated an understanding of the instructions.  A copy of instructions were sent to the patient via MyChart.  The patient was advised to call back or seek an in-person evaluation if the symptoms worsen or if the condition fails to improve as anticipated.  Time:  I spent 7 minutes with the patient via telehealth technology discussing the above problems/concerns.    Norma Daphine Deutscher, FNP

## 2022-06-14 NOTE — Patient Instructions (Signed)
  Claudean Kinds, thank you for joining Bennie Pierini, FNP for today's virtual visit.  While this provider is not your primary care provider (PCP), if your PCP is located in our provider database this encounter information will be shared with them immediately following your visit.   A [facility] MyChart account gives you access to today's visit and all your visits, tests, and labs performed at [facility] " click here if you don't have a [facility] MyChart account or go to [email or web address]  Consent: (Patient) Norma Morris provided verbal consent for this virtual visit at the beginning of the encounter.  Current Medications:  Current Outpatient Medications:    cephALEXin (KEFLEX) 500 MG capsule, Take 1 capsule (500 mg total) by mouth 3 (three) times daily., Disp: 21 capsule, Rfl: 0   albuterol (VENTOLIN HFA) 108 (90 Base) MCG/ACT inhaler, Inhale 1-2 puffs into the lungs every 6 (six) hours as needed for wheezing or shortness of breath., Disp: 1 each, Rfl: 0   albuterol (VENTOLIN HFA) 108 (90 Base) MCG/ACT inhaler, Inhale 2 puffs into the lungs every 6 (six) hours as needed for wheezing or shortness of breath., Disp: 8 g, Rfl: 0   budesonide-formoterol (SYMBICORT) 160-4.5 MCG/ACT inhaler, Inhale 2 puffs into the lungs 2 (two) times daily., Disp: 1 Inhaler, Rfl: 0   clotrimazole (LOTRIMIN) 1 % cream, Apply 1 Application topically 2 (two) times daily., Disp: 30 g, Rfl: 0   naproxen (NAPROSYN) 375 MG tablet, Take 1 tablet (375 mg total) by mouth 2 (two) times daily., Disp: 30 tablet, Rfl: 0   triamcinolone cream (KENALOG) 0.1 %, Apply 1 Application topically 2 (two) times daily., Disp: 30 g, Rfl: 0   Medications ordered in this encounter:  Meds ordered this encounter  Medications   cephALEXin (KEFLEX) 500 MG capsule    Sig: Take 1 capsule (500 mg total) by mouth 3 (three) times daily.    Dispense:  21 capsule    Refill:  0    Order Specific Question:    Supervising Provider    Answer:   Merrilee Jansky [id number]     *If you need refills on other medications prior to your next appointment, please contact your pharmacy*  Follow-Up: Call back or seek an in-person evaluation if the symptoms worsen or if the condition fails to improve as anticipated.  [facility] [phone]  Other Instructions Avoid rubbing or scratching rash Follow up with PCP if does not improve Take new antibiotic with food   If you have been instructed to have an in-person evaluation today at a local Urgent Care facility, please use the link below. It will take you to a list of all of our available [facility], including address, phone number and hours of operation. Please do not delay care.  [facility]  If you or a family member do not have a primary care provider, use the link below to schedule a visit and establish care. When you choose a [facility] primary care physician or advanced practice provider, you gain a long-term partner in health. Find a Primary Care Provider  Learn more about [facility]'s in-office and virtual care options:  - Get Care Now

## 2022-12-14 ENCOUNTER — Telehealth: Payer: BC Managed Care – PPO | Admitting: Family Medicine

## 2022-12-14 DIAGNOSIS — R21 Rash and other nonspecific skin eruption: Secondary | ICD-10-CM | POA: Diagnosis not present

## 2022-12-14 MED ORDER — PREDNISONE 20 MG PO TABS
20.0000 mg | ORAL_TABLET | Freq: Two times a day (BID) | ORAL | 0 refills | Status: AC
Start: 2022-12-14 — End: 2022-12-19

## 2022-12-14 NOTE — Progress Notes (Signed)
E Visit for Rash  We are sorry that you are not feeling well. Here is how we plan to help!  I will be sending prednisone to take as directed, continue hydrocortisone.     HOME CARE:  Take cool showers and avoid direct sunlight. Apply cool compress or wet dressings. Take a bath in an oatmeal bath.  Sprinkle content of one Aveeno packet under running faucet with comfortably warm water.  Bathe for 15-20 minutes, 1-2 times daily.  Pat dry with a towel. Do not rub the rash. Use hydrocortisone cream. Take an antihistamine like Benadryl for widespread rashes that itch.  The adult dose of Benadryl is 25-50 mg by mouth 4 times daily. Caution:  This type of medication may cause sleepiness.  Do not drink alcohol, drive, or operate dangerous machinery while taking antihistamines.  Do not take these medications if you have prostate enlargement.  Read package instructions thoroughly on all medications that you take.  GET HELP RIGHT AWAY IF:  Symptoms don't go away after treatment. Severe itching that persists. If you rash spreads or swells. If you rash begins to smell. If it blisters and opens or develops a yellow-brown crust. You develop a fever. You have a sore throat. You become short of breath.  MAKE SURE YOU:  Understand these instructions. Will watch your condition. Will get help right away if you are not doing well or get worse.  Thank you for choosing an e-visit.  Your e-visit answers were reviewed by a board certified advanced clinical practitioner to complete your personal care plan. Depending upon the condition, your plan could have included both over the counter or prescription medications.  Please review your pharmacy choice. Make sure the pharmacy is open so you can pick up prescription now. If there is a problem, you may contact your provider through [employer] and have the prescription routed to another pharmacy.  Your safety is important to [place]. If you have drug  allergies check your prescription carefully.   For the next 24 hours you can use MyChart to ask questions about today's visit, request a non-urgent call back, or ask for a work or school excuse. You will get an email in the next two days asking about your experience. I hope that your e-visit has been valuable and will speed your recovery.    have provided 5 minutes of non face to face time during this encounter for chart review and documentation.

## 2023-01-02 ENCOUNTER — Ambulatory Visit
Admission: RE | Admit: 2023-01-02 | Discharge: 2023-01-02 | Disposition: A | Discharge status: Home / Self Care | Payer: BC Managed Care – PPO | Source: Ambulatory Visit | Attending: Internal Medicine | Admitting: Internal Medicine

## 2023-01-02 VITALS — BP 135/80 | HR 68 | Temp 98.9°F | Resp 16 | Ht 60.0 in | Wt 149.0 lb

## 2023-01-02 DIAGNOSIS — L02212 Cutaneous abscess of back [any part, except buttock]: Secondary | ICD-10-CM | POA: Diagnosis not present

## 2023-01-02 MED ORDER — SULFAMETHOXAZOLE-TRIMETHOPRIM 800-160 MG PO TABS: 1.0000 | ORAL_TABLET | Freq: Two times a day (BID) | ORAL | 0 refills | Status: AC

## 2023-01-02 MED ORDER — NAPROXEN 500 MG PO TABS: 500.0000 mg | ORAL_TABLET | Freq: Two times a day (BID) | ORAL | 0 refills | Status: AC | PRN

## 2023-01-02 NOTE — ED Triage Notes (Signed)
Patient c/o possible abscess on her back about in the middle x 3 days.  The area is red, swollen and painful.  Patient denies any OTC pain meds.

## 2023-01-02 NOTE — Discharge Instructions (Addendum)
Bactrim twice daily for 7 days.  Continue warm compresses frequently.  May take naproxen twice daily as needed for pain.  Please follow-up with your PCP in 2 days for recheck.  Please go to the ER if you develop any worsening symptoms.  I hope you feel better soon!

## 2023-01-02 NOTE — ED Provider Notes (Signed)
[facility]    CSN: [id number] Arrival date & time: 01/02/23  1757      History   Chief Complaint Chief Complaint  Patient presents with   Abscess    I have a spot on my back that is very painful, red, and swolen. - Entered by patient    HPI Norma Morris is a 27 y.o. female presents for evaluation of an abscess.  Patient reports 3 days of a painful abscess to her right back.  Denies any known injury or inciting event.  No fevers or chills.  Reports she had an abscess once when she was 11 but denies any other history of abscesses.  Denies history of MRSA.  She has been doing warm compresses without improvement.  Denies any active drainage.  No other concerns at this time.   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      Home Medications    Prior to Admission medications   Medication Sig Start Date End Date Taking? Authorizing Provider  albuterol (VENTOLIN HFA) 108 (90 Base) MCG/ACT inhaler Inhale 1-2 puffs into the lungs every 6 (six) hours as needed for wheezing or shortness of breath. 12/09/20  Yes Mound, Acie Fredrickson, FNP  albuterol (VENTOLIN HFA) 108 (90 Base) MCG/ACT inhaler Inhale 2 puffs into the lungs every 6 (six) hours as needed for wheezing or shortness of breath. 01/17/22  Yes Viviano Simas, FNP  budesonide-formoterol (SYMBICORT) 160-4.5 MCG/ACT inhaler Inhale 2 puffs into the lungs 2 (two) times daily. 08/03/17  Yes Burky, Dorene Grebe B, NP  naproxen (NAPROSYN) 500 MG tablet Take 1 tablet (500 mg total) by mouth 2 (two) times daily as needed for up to  7 days. 01/02/23 01/09/23 Yes Radford Pax, NP  sulfamethoxazole-trimethoprim (BACTRIM DS) 800-160 MG tablet Take 1 tablet by mouth 2 (two) times daily for 7 days. 01/02/23 01/09/23 Yes Radford Pax, NP  cephALEXin (KEFLEX) 500 MG capsule Take 1 capsule (500 mg total) by mouth 3 (three) times daily. 06/14/22   Daphine Deutscher, Mary-Margaret, FNP  clotrimazole (LOTRIMIN) 1 % cream Apply 1 Application topically 2 (two) times daily. 03/23/22   Margaretann Loveless, PA-C  triamcinolone cream (KENALOG) 0.1 % Apply 1 Application topically 2 (two) times daily. 03/23/22   Margaretann Loveless, PA-C    Family History Family History  Problem Relation Age of Onset   Asthma Mother    Healthy Father    Heart disease Maternal Grandmother     Social History Social History   Tobacco Use   Smoking status: Never   Smokeless tobacco: Never  Vaping Use   Vaping status: Never Used  Substance Use Topics   Alcohol use: Yes    Comment: occ   Drug use: No     Allergies   Asa [aspirin], Doxycycline, Shellfish allergy, and Flexeril [cyclobenzaprine]   Review of Systems Review of Systems  Skin:  Positive for wound.     Physical Exam Triage Vital Signs  ED Triage Vitals  Encounter Vitals Group     BP 01/02/23 1831 135/80     Systolic BP Percentile --      Diastolic BP Percentile --      Pulse Rate 01/02/23 1831 68     Resp 01/02/23 1831 16     Temp 01/02/23 1831 98.9 F (37.2 C)     Temp Source 01/02/23 1831 Oral     SpO2 01/02/23 1831 99 %     Weight 01/02/23 1833 149 lb (67.6 kg)     Height 01/02/23 1833 5' (1.524 m)     Head Circumference --      Peak Flow --      Pain Score 01/02/23 1833 6     Pain Loc --      Pain Education --      Exclude from Growth Chart --    No data found.  Updated Vital Signs BP 135/80 (BP Location: Right Arm)   Pulse 68   Temp 98.9 F (37.2 C) (Oral)   Resp 16   Ht 5' (1.524 m)   Wt 149 lb (67.6 kg)   LMP 12/19/2022   SpO2 99%   BMI 29.10 kg/m   Visual  Acuity Right Eye Distance:   Left Eye Distance:   Bilateral Distance:    Right Eye Near:   Left Eye Near:    Bilateral Near:     Physical Exam Vitals and nursing note reviewed.  Constitutional:      General: She is not in acute distress.    Appearance: Normal appearance. She is not ill-appearing, toxic-appearing or diaphoretic.  HENT:     Head: Normocephalic and atraumatic.  Eyes:     Pupils: Pupils are equal, round, and reactive to light.  Cardiovascular:     Rate and Rhythm: Normal rate.  Pulmonary:     Effort: Pulmonary effort is normal.  Skin:    General: Skin is warm and dry.          Comments: There is a 3 x 1.5 cm indurated nonfluctuant erythematous abscess to the right mid back.  No active drainage.  Tender to palpation.  Neurological:     General: No focal deficit present.     Mental Status: She is alert and oriented to person, place, and time.  Psychiatric:        Mood and Affect: Mood normal.        Behavior: Behavior normal.      UC Treatments / Results  Labs (all labs ordered are listed, but only abnormal results are displayed) Labs Reviewed - No data to display  EKG   Radiology No results found.  Procedures Procedures (including critical care time)  Medications Ordered in UC Medications - No data to display  Initial Impression / Assessment and Plan / UC Course  I have reviewed the triage vital signs and the nursing notes.  Pertinent labs & imaging results that were available during my care of the patient were reviewed by me and considered in my medical decision making (see chart for details).     Reviewed exam and symptoms with patient.  No red flags.  No indication for I&D based on exam.  Will start Bactrim twice daily for 7 days and encouraged warm compresses.  Naproxen as needed for pain.  PCP follow-up 2 days for recheck.  ER precautions reviewed and patient verbalized understanding. Final Clinical Impressions(s) / UC Diagnoses   Final  diagnoses:  Abscess of back  Discharge Instructions      Bactrim twice daily for 7 days.  Continue warm compresses frequently.  May take naproxen twice daily as needed for pain.  Please follow-up with your PCP in 2 days for recheck.  Please go to the ER if you develop any worsening symptoms.  I hope you feel better soon!    ED Prescriptions     Medication Sig Dispense Auth. Provider   sulfamethoxazole-trimethoprim (BACTRIM DS) 800-160 MG tablet Take 1 tablet by mouth 2 (two) times daily for 7 days. 14 tablet Radford Pax, NP   naproxen (NAPROSYN) 500 MG tablet Take 1 tablet (500 mg total) by mouth 2 (two) times daily as needed for up to 7 days. 14 tablet Radford Pax, NP      PDMP not reviewed this encounter.   Radford Pax, NP 01/02/23 1850

## 2023-01-09 ENCOUNTER — Other Ambulatory Visit: Payer: Self-pay | Admitting: Family Medicine

## 2023-01-09 DIAGNOSIS — L0291 Cutaneous abscess, unspecified: Secondary | ICD-10-CM | POA: Insufficient documentation

## 2023-01-09 DIAGNOSIS — N611 Abscess of the breast and nipple: Secondary | ICD-10-CM | POA: Insufficient documentation

## 2023-01-09 DIAGNOSIS — L02212 Cutaneous abscess of back [any part, except buttock]: Secondary | ICD-10-CM | POA: Insufficient documentation

## 2023-01-12 ENCOUNTER — Ambulatory Visit
Admission: RE | Admit: 2023-01-12 | Discharge: 2023-01-12 | Discharge status: Home / Self Care | Payer: BC Managed Care – PPO | Source: Ambulatory Visit | Attending: Family Medicine | Admitting: Family Medicine

## 2023-01-12 ENCOUNTER — Ambulatory Visit
Admission: RE | Admit: 2023-01-12 | Discharge: 2023-01-12 | Disposition: A | Discharge status: Home / Self Care | Payer: BC Managed Care – PPO | Source: Ambulatory Visit | Attending: Family Medicine | Admitting: Family Medicine

## 2023-01-12 DIAGNOSIS — N611 Abscess of the breast and nipple: Secondary | ICD-10-CM

## 2023-01-23 DIAGNOSIS — B958 Unspecified staphylococcus as the cause of diseases classified elsewhere: Secondary | ICD-10-CM | POA: Insufficient documentation

## 2023-01-23 DIAGNOSIS — N61 Mastitis without abscess: Secondary | ICD-10-CM | POA: Insufficient documentation

## 2023-06-05 ENCOUNTER — Inpatient Hospital Stay (HOSPITAL_COMMUNITY)
Admission: AD | Admit: 2023-06-05 | Discharge: 2023-06-06 | Disposition: A | Discharge status: Home / Self Care | Attending: Obstetrics and Gynecology | Admitting: Obstetrics and Gynecology

## 2023-06-05 ENCOUNTER — Encounter (HOSPITAL_COMMUNITY): Payer: Self-pay | Admitting: *Deleted

## 2023-06-05 DIAGNOSIS — Z3A01 Less than 8 weeks gestation of pregnancy: Secondary | ICD-10-CM

## 2023-06-05 DIAGNOSIS — O219 Vomiting of pregnancy, unspecified: Secondary | ICD-10-CM | POA: Diagnosis present

## 2023-06-05 MED ORDER — ONDANSETRON 4 MG PO TBDP
8.0000 mg | ORAL_TABLET | Freq: Once | ORAL | Status: AC
  Administered 2023-06-06: 8 mg via ORAL
  Filled 2023-06-05: qty 2

## 2023-06-05 NOTE — MAU Note (Signed)
 Pt says she goes to [facility]. On 05-26-2023. Was in office yesterday- drew labs , UPT- positive  On MY Chart- Positive UPT Tonight - nausea all day - she called office at 930pm- told to come here.  Vomiting x3 today

## 2023-06-06 DIAGNOSIS — O219 Vomiting of pregnancy, unspecified: Secondary | ICD-10-CM | POA: Diagnosis not present

## 2023-06-06 DIAGNOSIS — Z3A01 Less than 8 weeks gestation of pregnancy: Secondary | ICD-10-CM

## 2023-06-06 LAB — URINALYSIS, ROUTINE W REFLEX MICROSCOPIC
Bilirubin Urine: NEGATIVE
Glucose, UA: NEGATIVE mg/dL
Hgb urine dipstick: NEGATIVE
Ketones, ur: NEGATIVE mg/dL
Leukocytes,Ua: NEGATIVE
Nitrite: NEGATIVE
Protein, ur: NEGATIVE mg/dL
Specific Gravity, Urine: 1.017 (ref 1.005–1.030)
pH: 7 (ref 5.0–8.0)

## 2023-06-06 LAB — COMPREHENSIVE METABOLIC PANEL WITH GFR
ALT: 16 U/L (ref 0–44)
AST: 16 U/L (ref 15–41)
Albumin: 4.1 g/dL (ref 3.5–5.0)
Alkaline Phosphatase: 51 U/L (ref 38–126)
Anion gap: 13 (ref 5–15)
BUN: 7 mg/dL (ref 6–20)
CO2: 22 mmol/L (ref 22–32)
Calcium: 10.2 mg/dL (ref 8.9–10.3)
Chloride: 101 mmol/L (ref 98–111)
Creatinine, Ser: 0.73 mg/dL (ref 0.44–1.00)
GFR, Estimated: >60 mL/min (ref >60)
Glucose, Bld: 86 mg/dL (ref 70–99)
Potassium: 4 mmol/L (ref 3.5–5.1)
Sodium: 136 mmol/L (ref 135–145)
Total Bilirubin: 0.6 mg/dL (ref 0.0–1.2)
Total Protein: 7 g/dL (ref 6.5–8.1)

## 2023-06-06 LAB — CBC
HCT: 40.7 % (ref 36.0–46.0)
Hemoglobin: 13.2 g/dL (ref 12.0–15.0)
MCH: 27.3 pg (ref 26.0–34.0)
MCHC: 32.4 g/dL (ref 30.0–36.0)
MCV: 84.3 fL (ref 80.0–100.0)
Platelets: 274 10*3/uL (ref 150–400)
RBC: 4.83 MIL/uL (ref 3.87–5.11)
RDW: 12.8 % (ref 11.5–15.5)
WBC: 9 10*3/uL (ref 4.0–10.5)
nRBC: 0 % (ref 0.0–0.2)

## 2023-06-06 MED ORDER — ONDANSETRON HCL 4 MG PO TABS: 8.0000 mg | ORAL_TABLET | Freq: Two times a day (BID) | ORAL | 0 refills | Status: DC

## 2023-06-06 NOTE — MAU Provider Note (Signed)
 S [name] is a 28 y.o. G1P0 patient who presents to MAU today with complaint of Nausea/ Vomiting at [redacted]w[redacted]d . She has established care with a confirmed pregnancy at [facility]. Denies any VB or  abdominal pain. She reports 3 episodes of vomiting today and has no antiemetics at home.   O BP 127/76 (BP Location: Right Arm)   Pulse 76   Temp 98 F (36.7 C) (Oral)   Resp 12   Ht 4\' 11"  (1.499 m)   Wt 71.2 kg   LMP 04/23/2023   BMI 31.69 kg/m  Physical Exam Vitals and nursing note reviewed.  Constitutional:      General: She is not in acute distress.    Appearance: Normal appearance. She is obese. She is not ill-appearing.  HENT:     Head: Normocephalic.     Nose: Nose normal.     Mouth/Throat:     Mouth: Mucous membranes are moist.  Cardiovascular:     Rate and Rhythm: Normal rate.  Pulmonary:     Effort: Pulmonary effort is normal.  Abdominal:     Palpations: Abdomen is soft.  Musculoskeletal:        General: Normal range of motion.     Cervical back: Normal range of motion.  Skin:    General: Skin is warm.  Neurological:     Mental Status: She is alert and oriented to person, place, and time.  Psychiatric:        Mood and Affect: Mood normal.        Behavior: Behavior normal.     MDM  Moderate  N/V in early  pregnancy CBC, CMP, U/A obtained to r/o electrolyte abnormalities/ dehydration ( No significant findings) Zofran  Oral administered with good relief  PO Challenge tolerated  Plan to discharge home on Zofran  for antiemetics    Orders Placed This Encounter  Procedures   Urinalysis, Routine w reflex microscopic -Urine, Clean Catch    Standing Status:   Standing    Number of Occurrences:   1    Specimen Source:   Urine, Clean Catch [76]   CBC    Standing Status:   Standing    Number of Occurrences:   1   Comprehensive metabolic panel    Standing Status:   Standing    Number of Occurrences:   1   Discharge patient Discharge disposition:  01-Home or Self Care; Discharge patient date: 06/06/2023    Standing Status:   Standing    Number of Occurrences:   1    Discharge disposition:   01-Home or Self Care [1]    Discharge patient date:   06/06/2023      Results for orders placed or performed during the hospital encounter of 06/05/23 (from the past 24 hours)  Urinalysis, Routine w reflex microscopic -Urine, Clean Catch     Status: None   Collection Time: 06/05/23 11:43 PM  Result Value Ref Range   Color, Urine YELLOW YELLOW   APPearance CLEAR CLEAR   Specific Gravity, Urine 1.017 1.005 - 1.030   pH 7.0 5.0 - 8.0   Glucose, UA NEGATIVE NEGATIVE mg/dL   Hgb urine dipstick NEGATIVE NEGATIVE   Bilirubin Urine NEGATIVE NEGATIVE   Ketones, ur NEGATIVE NEGATIVE mg/dL   Protein, ur NEGATIVE NEGATIVE mg/dL   Nitrite NEGATIVE NEGATIVE   Leukocytes,Ua NEGATIVE NEGATIVE  CBC     Status: None   Collection Time: 06/06/23 12:01 AM  Result Value Ref Range  WBC 9.0 4.0 - 10.5 K/uL   RBC 4.83 3.87 - 5.11 MIL/uL   Hemoglobin 13.2 12.0 - 15.0 g/dL   HCT 78.2 95.6 - 21.3 %   MCV 84.3 80.0 - 100.0 fL   MCH 27.3 26.0 - 34.0 pg   MCHC 32.4 30.0 - 36.0 g/dL   RDW 08.6 57.8 - 46.9 %   Platelets 274 150 - 400 K/uL   nRBC 0.0 0.0 - 0.2 %  Comprehensive metabolic panel     Status: None   Collection Time: 06/06/23 12:01 AM  Result Value Ref Range   Sodium 136 135 - 145 mmol/L   Potassium 4.0 3.5 - 5.1 mmol/L   Chloride 101 98 - 111 mmol/L   CO2 22 22 - 32 mmol/L   Glucose, Bld 86 70 - 99 mg/dL   BUN 7 6 - 20 mg/dL   Creatinine, Ser 6.29 0.44 - 1.00 mg/dL   Calcium 52.8 8.9 - 41.3 mg/dL   Total Protein 7.0 6.5 - 8.1 g/dL   Albumin 4.1 3.5 - 5.0 g/dL   AST 16 15 - 41 U/L   ALT 16 0 - 44 U/L   Alkaline Phosphatase 51 38 - 126 U/L   Total Bilirubin 0.6 0.0 - 1.2 mg/dL   GFR, Estimated >24 >40 mL/min   Anion gap 13 5 - 15     I have reviewed the patient chart and performed the physical exam . I have ordered & interpreted the lab  results and reviewed them with the patient  Medications ordered as stated below.  A/P as described below.  Counseling and education provided and patient agreeable  with plan as described below. Verbalized understanding.    ASSESSMENT Medical screening exam complete 1. [redacted] weeks gestation of pregnancy  2. Nausea and vomiting in pregnancy (Primary)    PLAN  Discharge from MAU in stable condition  Follow up with OB   See AVS for full description of educational information and instructions provided to the patient at time of discharge   Warning signs for worsening condition that would warrant emergency follow-up discussed  Patient may return to MAU as needed   Cherlynn Cornfield, NP 06/06/2023 1:19 AM

## 2023-09-14 ENCOUNTER — Telehealth: Payer: Self-pay

## 2023-09-25 LAB — OB RESULTS CONSOLE GC/CHLAMYDIA
Chlamydia: NEGATIVE
Neisseria Gonorrhea: NEGATIVE

## 2023-09-25 LAB — OB RESULTS CONSOLE RPR: RPR: NONREACTIVE

## 2023-09-25 LAB — OB RESULTS CONSOLE RUBELLA ANTIBODY, IGM: Rubella: IMMUNE

## 2023-09-25 LAB — OB RESULTS CONSOLE HIV ANTIBODY (ROUTINE TESTING): HIV: NONREACTIVE

## 2023-09-25 LAB — OB RESULTS CONSOLE HEPATITIS B SURFACE ANTIGEN: Hepatitis B Surface Ag: NEGATIVE

## 2023-09-28 ENCOUNTER — Ambulatory Visit: Attending: Obstetrics and Gynecology

## 2023-09-28 DIAGNOSIS — D563 Thalassemia minor: Secondary | ICD-10-CM | POA: Diagnosis not present

## 2023-09-28 NOTE — Progress Notes (Signed)
 [facility] at [facility] [address], [address] Phone:  [phone]   Fax:  [phone]      In-Person Genetic Counseling Clinic Note:   I spoke with 28 y.o. Norma Morris today to discuss her carrier Morris results. She was referred by Norma Savant, DO.   Pregnancy History:    G1P0. EGA: [redacted]w[redacted]d by LMP. EDD: 01/28/2024. Denies major personal health concerns. Denies bleeding, infections, and fevers in this pregnancy. Denies using tobacco, alcohol, or street drugs in this pregnancy.   Family History:    A three-generation pedigree was created and scanned into Epic under the Media tab.  Patient reports she was diagnosed with an enlarged left ventricle at 28 yo and required medication until she was 69-10 yo. No current concerns. Without known medical or genetic testing results, recurrence risk is difficult to estimate.  Patient reports her sister has a set of twins (son and daughter) who were diagnosed with autism. She also reports her sister as a 78 yo daughter who has a son with a possible autism diagnosis. No known genetic testing performed on these family members. We discussed that we are unable to directly test for autism in a pregnancy. Genetic testing for individuals with a clinical diagnosis of autism yields an explanation in only about 20% of cases, and the remaining 80% of cases are left with unknown etiology. Having an affected family member may increase the chance that Norma Morris's children will have autism; however, without genetic testing performed on affected family members, it is difficult to assess risk to the pregnancy and other family members. The risk may be up to 50% in the case of an identified genetic cause. She was not found to be a carrier for fragile X syndrome per ACOG recommendations.  She reports a family history of mental health conditions such as schizophrenia in her mother. We discussed mental health  conditions are typically multifactorial in nature and not usually not due to a single genetic cause.  Patient reports [name] had a history of multiple miscarriages with a previous partner. Patient reports a maternal aunt who had a stillbirth and another maternal aunt with multiple miscarriages. SABs can occur due to many different reasons including genetic causes. Some individuals are balanced translocation carriers which can increase risk for recurrent miscarriages and infertility. If desired, we can discuss and offer further testing for the patient and [name].  Maternal ethnicity reported as Black/White and paternal ethnicity reported as Black. Denies Ashkenazi Jewish ancestry.  Family history not remarkable for consanguinity, individuals with birth defects, intellectual disability, autism spectrum disorder, multiple spontaneous abortions, still births, or unexplained neonatal death.   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 She was not found to be a carrier for the other 13 conditions screened for which significantly reduces but does not eliminate the chance of being a carrier for these conditions. Please see report for details.   We reviewed the clinical symptoms of alpha thalassemia, the mode of inheritance, and testing methodology.    Her reproductive partner Norma Morris was also found to be a silent carrier for alpha thalassemia. He is also positive for the 3.7 deletion in the HBA2 gene (??/-?). Please see report for details. We discussed that this couple's current and future pregnancies are not at an increased risk for alpha  thalassemia, including hemoglobin Bart's disease. Based on the results, there is a 25% chance the current pregnancy would be an alpha thalassemia carrier in the trans configuration (-?/-?). There is a 50% chance the  pregnancy would be silent carrier for alpha thalassemia (??/-?). There is a 25% chance the pregnancy would be unaffected and not a carrier (??/??).   Newborn Morris. The Norma Morris (NBS) program will screen all newborn babies for cystic fibrosis, spinal muscular atrophy, hemoglobinopathies, and numerous other conditions. Of note, the NBS does not screen for alpha thalassemia.  Previous Testing Completed:  Low risk NIPS: Norma Morris previously completed noninvasive prenatal Morris (NIPS) in this pregnancy. The result is low risk, consistent with a female fetus. This Morris significantly reduces but does not eliminate the chance that the current pregnancy has Down syndrome (trisomy 27), trisomy 27, trisomy 3, and common sex chromosome conditions. Please see report for details. There are many genetic conditions that cannot be detected by NIPS.   Negative ms-AFP Morris: Norma Morris previously completed a maternal serum AFP screen in this pregnancy. The result is screen negative. Please see report for details. A negative result reduces the risk that the current pregnancy has an open neural tube defect. Closed neural tube defects and some open defects may not be detected by this screen.   Plan of Care:   Patient ha a follow-up ultrasound with her OB this Friday. If any concerns arise on the ultrasound, a referral to our Norma Morris clinic for an anatomy ultrasound can be placed. Routine prenatal care.   Informed consent was obtained. All questions were answered.   60 minutes were spent on the date of the encounter in service to the patient including preparation, face-to-face consultation, discussion of test reports and available next steps, pedigree construction, genetic risk assessment, documentation, and care coordination.    Thank you for sharing in the care of Norma Morris with us .  Please do not hesitate to contact us  at [phone] if you have any questions.   Norma Bodily, MS,  [employer] [facility] Certified Genetic Counselor   Genetic counseling student involved in appointment: Yes Norma Morris).

## 2023-10-05 ENCOUNTER — Other Ambulatory Visit: Payer: Self-pay

## 2023-10-05 DIAGNOSIS — O365992 Maternal care for other known or suspected poor fetal growth, unspecified trimester, fetus 2: Secondary | ICD-10-CM

## 2023-10-06 ENCOUNTER — Telehealth: Payer: Self-pay

## 2023-10-08 ENCOUNTER — Other Ambulatory Visit: Payer: Self-pay

## 2023-10-08 DIAGNOSIS — O365921 Maternal care for other known or suspected poor fetal growth, second trimester, fetus 1: Secondary | ICD-10-CM

## 2023-10-13 ENCOUNTER — Other Ambulatory Visit (HOSPITAL_COMMUNITY): Payer: Self-pay

## 2023-10-13 DIAGNOSIS — I517 Cardiomegaly: Secondary | ICD-10-CM

## 2023-10-16 ENCOUNTER — Ambulatory Visit (HOSPITAL_COMMUNITY)
Admission: RE | Admit: 2023-10-16 | Discharge: 2023-10-16 | Disposition: A | Discharge status: Home / Self Care | Source: Ambulatory Visit

## 2023-10-16 DIAGNOSIS — I517 Cardiomegaly: Secondary | ICD-10-CM | POA: Insufficient documentation

## 2023-10-16 DIAGNOSIS — Z3A25 25 weeks gestation of pregnancy: Secondary | ICD-10-CM | POA: Diagnosis not present

## 2023-10-16 DIAGNOSIS — O99412 Diseases of the circulatory system complicating pregnancy, second trimester: Secondary | ICD-10-CM | POA: Diagnosis not present

## 2023-10-16 NOTE — Progress Notes (Signed)
  Echocardiogram 2D Echocardiogram has been performed.  Norma Morris 10/16/2023, 3:42 PM

## 2023-10-20 LAB — ECHOCARDIOGRAM COMPLETE
Area-P 1/2: 3.12 cm2
S' Lateral: 3.4 cm
Single Plane A2C EF: 71.8 %

## 2023-10-26 ENCOUNTER — Other Ambulatory Visit: Payer: Self-pay

## 2023-10-26 ENCOUNTER — Ambulatory Visit (HOSPITAL_BASED_OUTPATIENT_CLINIC_OR_DEPARTMENT_OTHER): Admitting: Maternal & Fetal Medicine

## 2023-10-26 ENCOUNTER — Ambulatory Visit: Attending: Maternal & Fetal Medicine

## 2023-10-26 VITALS — BP 116/69 | HR 81

## 2023-10-26 DIAGNOSIS — Z363 Encounter for antenatal screening for malformations: Secondary | ICD-10-CM | POA: Diagnosis not present

## 2023-10-26 DIAGNOSIS — Z3A26 26 weeks gestation of pregnancy: Secondary | ICD-10-CM | POA: Insufficient documentation

## 2023-10-26 DIAGNOSIS — O99012 Anemia complicating pregnancy, second trimester: Secondary | ICD-10-CM

## 2023-10-26 DIAGNOSIS — D563 Thalassemia minor: Secondary | ICD-10-CM | POA: Diagnosis not present

## 2023-10-26 DIAGNOSIS — O365992 Maternal care for other known or suspected poor fetal growth, unspecified trimester, fetus 2: Secondary | ICD-10-CM | POA: Diagnosis not present

## 2023-10-26 DIAGNOSIS — O365920 Maternal care for other known or suspected poor fetal growth, second trimester, not applicable or unspecified: Secondary | ICD-10-CM | POA: Insufficient documentation

## 2023-10-26 DIAGNOSIS — Z148 Genetic carrier of other disease: Secondary | ICD-10-CM | POA: Diagnosis not present

## 2023-10-26 DIAGNOSIS — Z3400 Encounter for supervision of normal first pregnancy, unspecified trimester: Secondary | ICD-10-CM | POA: Insufficient documentation

## 2023-10-26 NOTE — Progress Notes (Signed)
 Patient information  Patient Name: Norma Morris  Patient MRN:   [id number]  Referring practice: [facility] Referring Provider: [facility]  Problem List   Patient Active Problem List   Diagnosis Date Noted   Primigravida 10/26/2023   Poor fetal growth affecting management of mother in second trimester 10/26/2023   Alpha thalassemia silent carrier 09/28/2023   Asthma 11/20/2016   Maternal Fetal Medicine Consult Norma Morris is a 28 y.o. G1P0 at [redacted]w[redacted]d here for ultrasound and consultation.    Today we focused on the following:   Poor fetal growth: The EFW was at the 19th percentile based on today's biometry measurements.  We discussed that this is at the lower end of normal.  This could represent the start of an early onset growth restriction or could represent normal constitutional growth of the fetus.  I discussed that fetal growth restriction is a diagnosis that must be made over time to assess the growth pattern is much as the absolute percentile measured that day.  The biometry of the fetus is uniformly at the lower range of normal.  There are no stigmata consistent with a genetic or infectious etiology.  I discussed the importance of follow-up in 3 weeks to assess the fetal growth.  We also discussed the rare but potential cause of a genetic or infectious etiology slowing the fetal growth down.  I discussed the role of amniocentesis which the patient declined.  She will return in 3 weeks for growth.  Umbilical artery Dopplers were done but not charged since the fetus did not meet criteria.  Sonographic findings Single intrauterine pregnancy at 26w 4d  Fetal cardiac activity:  Observed and appears normal. Presentation: Cephalic. The anatomic structures that were well seen appear normal without evidence of soft markers. Due to poor acoustic windows some structures remain suboptimally visualized. Fetal biometry shows the estimated fetal weight at the 19 percentile.  Amniotic  fluid: Within normal limits.  MVP: 6.18 cm. 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 20 tablet 0   Prenatal MV & Min w/FA-DHA (PRENATAL ADULT GUMMY/DHA/FA) 0.4-25 MG CHEW as directed Orally     albuterol  (VENTOLIN  HFA) 108 (90 Base) MCG/ACT inhaler Inhale 1-2 puffs into the lungs every 6 (six) hours as needed for wheezing or  shortness of breath. 1 each 0   albuterol  (VENTOLIN  HFA) 108 (90 Base) MCG/ACT inhaler Inhale 2 puffs into the lungs every 6 (six) hours as needed for wheezing or shortness of breath. 8 g 0   budesonide -formoterol  (SYMBICORT ) 160-4.5 MCG/ACT inhaler Inhale 2 puffs into the lungs 2 (two) times daily. (Patient not taking: Reported on 10/26/2023) 1 Inhaler 0   cephALEXin  (KEFLEX ) 500 MG capsule Take 1 capsule (500 mg total) by mouth 3 (three) times daily. (Patient not taking: Reported on 10/26/2023) 21 capsule 0   clotrimazole  (LOTRIMIN ) 1 % cream Apply 1 Application topically 2 (two) times daily. 30 g 0   triamcinolone  cream (KENALOG ) 0.1 % Apply 1  Application topically 2 (two) times daily. 30 g 0   No current facility-administered medications on file prior to visit.      Allergies:   Allergies  Allergen Reactions   Asa [Aspirin] Nausea Only   Doxycycline Nausea And Vomiting   Shellfish Allergy Itching   Flexeril  [Cyclobenzaprine ] Rash     Physical Exam:   Vitals:   10/26/23 1300  BP: 116/69  Pulse: 81   Sitting comfortably on the sonogram table Nonlabored breathing Normal rate and rhythm Abdomen is nontender  Thank you for the opportunity to be involved with this patient's care. Please let us  know if we can be of any further assistance.   45 minutes of time was spent reviewing the patient's chart including labs, imaging and documentation.  At least 50% of this time was spent with direct patient care discussing the diagnosis, management and prognosis of her care.  Norma Morris MFM, [place]   10/26/2023  2:42 PM

## 2023-11-04 ENCOUNTER — Ambulatory Visit
Admission: EM | Admit: 2023-11-04 | Discharge: 2023-11-04 | Disposition: A | Discharge status: Home / Self Care | Attending: Family Medicine | Admitting: Family Medicine

## 2023-11-04 DIAGNOSIS — N939 Abnormal uterine and vaginal bleeding, unspecified: Secondary | ICD-10-CM | POA: Insufficient documentation

## 2023-11-04 DIAGNOSIS — Z3400 Encounter for supervision of normal first pregnancy, unspecified trimester: Secondary | ICD-10-CM | POA: Insufficient documentation

## 2023-11-04 DIAGNOSIS — J4521 Mild intermittent asthma with (acute) exacerbation: Secondary | ICD-10-CM | POA: Diagnosis not present

## 2023-11-04 DIAGNOSIS — J069 Acute upper respiratory infection, unspecified: Secondary | ICD-10-CM

## 2023-11-04 DIAGNOSIS — R6889 Other general symptoms and signs: Secondary | ICD-10-CM | POA: Diagnosis not present

## 2023-11-04 DIAGNOSIS — M5430 Sciatica, unspecified side: Secondary | ICD-10-CM | POA: Insufficient documentation

## 2023-11-04 DIAGNOSIS — N921 Excessive and frequent menstruation with irregular cycle: Secondary | ICD-10-CM | POA: Insufficient documentation

## 2023-11-04 LAB — POC COVID19/FLU A&B COMBO
Covid Antigen, POC: NEGATIVE
Influenza A Antigen, POC: NEGATIVE
Influenza B Antigen, POC: NEGATIVE

## 2023-11-04 MED ORDER — PREDNISONE 20 MG PO TABS: 40.0000 mg | ORAL_TABLET | Freq: Every day | ORAL | 0 refills | Status: DC

## 2023-11-04 NOTE — ED Triage Notes (Signed)
 Patient reports onset of sore, scratchy throat on Saturday night, which persisted through last night when a cough began. OB/GYN has recommended testing for COVID-19 and influenza. Patient denies known fever.

## 2023-11-07 NOTE — ED Provider Notes (Signed)
 [facility]   [id number] 11/04/23 Arrival Time: 1543  ASSESSMENT & PLAN:  1. Mild intermittent asthma with acute exacerbation   2. Not feeling great   3. Viral URI with cough    Without resp distress. Results for orders placed or performed during the hospital encounter of 11/04/23  POC Covid19/Flu A&B Antigen   Collection Time: 11/04/23  4:15 PM  Result Value Ref Range   Influenza A Antigen, POC Negative Negative   Influenza B Antigen, POC Negative Negative   Covid Antigen, POC Negative Negative   OTC symptom care as needed.  Meds ordered this encounter  Medications   predniSONE  (DELTASONE ) 20 MG tablet    Sig: Take 2 tablets (40 mg total) by mouth daily.    Dispense:  10 tablet    Refill:  0     Follow-up Information     Toepfer, Olivia, PA-C.   Specialty: Physician Assistant Why: As needed. Contact information: [address] [place] [address] [phone]                 Reviewed expectations re: course of current medical issues. Questions answered. Outlined signs and symptoms indicating need for more acute intervention. Understanding verbalized. After Visit Summary given.   SUBJECTIVE: History from: Patient. Norma Morris is a 28 y.o. female. Patient reports onset of sore, scratchy throat on Saturday night, which persisted through last night when a cough began. OB/GYN has recommended testing for COVID-19 and influenza. Patient denies known fever. Denies: fever. Normal PO intake without n/v/d.  OBJECTIVE:  Vitals:   11/04/23 1552 11/04/23 1555  BP:  113/76  Pulse:  (!) 102  Resp:  20  Temp:  98.3 F (36.8 C)  TempSrc:  Oral  SpO2:  97%  Weight: 72.3 kg   Height: 4' 11 (1.499 m)     General appearance: alert; no distress Eyes: PERRLA; EOMI; conjunctiva normal HENT: [place]; AT; with nasal congestion Neck: supple  Lungs: speaks full sentences without difficulty; unlabored; mild bilat wheezing Extremities: no  edema Skin: warm and dry Neurologic: normal gait Psychological: alert and cooperative; normal mood and affect  Labs: Results for orders placed or performed during the hospital encounter of 11/04/23  POC Covid19/Flu A&B Antigen   Collection Time: 11/04/23  4:15 PM  Result Value Ref Range   Influenza A Antigen, POC Negative Negative   Influenza B Antigen, POC Negative Negative   Covid Antigen, POC Negative Negative   Labs Reviewed  POC COVID19/FLU A&B COMBO - Normal    Imaging: No results found.  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; no medication currently   Knee gives out    Social History   Socioeconomic History   Marital status: Single    Spouse name: Not on file   Number of children: Not on file   Years of education: Not on file  Highest education level: Not on file  Occupational History   Not on file  Tobacco Use   Smoking status: Never   Smokeless tobacco: Never  Vaping Use   Vaping status: Never Used  Substance and Sexual Activity   Alcohol use: Not Currently    Comment: occ   Drug use: No   Sexual activity: Not Currently    Birth control/protection: None  Other Topics Concern   Not on file  Social History Narrative   Not on file   Social Drivers of Health   Financial Resource Strain: Not on file  Food Insecurity: Not on file  Transportation Needs: Not on file  Physical Activity:  Not on file  Stress: Not on file  Social Connections: Not on file  Intimate Partner Violence: Not on file   Family History  Problem Relation Age of Onset   Asthma Mother    Healthy Father    Heart disease Maternal Grandmother    Past Surgical History:  Procedure Laterality Date   dental surgery        Rolinda Rogue, MD 11/07/23 1015

## 2023-11-16 ENCOUNTER — Ambulatory Visit

## 2023-11-16 ENCOUNTER — Other Ambulatory Visit: Payer: Self-pay

## 2023-11-16 ENCOUNTER — Ambulatory Visit: Attending: Maternal & Fetal Medicine

## 2023-11-16 DIAGNOSIS — O365930 Maternal care for other known or suspected poor fetal growth, third trimester, not applicable or unspecified: Secondary | ICD-10-CM | POA: Diagnosis not present

## 2023-11-16 DIAGNOSIS — O35EXX0 Maternal care for other (suspected) fetal abnormality and damage, fetal genitourinary anomalies, not applicable or unspecified: Secondary | ICD-10-CM

## 2023-11-16 DIAGNOSIS — O365920 Maternal care for other known or suspected poor fetal growth, second trimester, not applicable or unspecified: Secondary | ICD-10-CM | POA: Diagnosis present

## 2023-11-16 DIAGNOSIS — Z3A29 29 weeks gestation of pregnancy: Secondary | ICD-10-CM | POA: Diagnosis not present

## 2023-11-16 DIAGNOSIS — Z148 Genetic carrier of other disease: Secondary | ICD-10-CM | POA: Diagnosis not present

## 2023-11-16 DIAGNOSIS — O358XX0 Maternal care for other (suspected) fetal abnormality and damage, not applicable or unspecified: Secondary | ICD-10-CM | POA: Diagnosis not present

## 2023-11-23 ENCOUNTER — Other Ambulatory Visit

## 2023-12-02 ENCOUNTER — Other Ambulatory Visit

## 2023-12-09 ENCOUNTER — Ambulatory Visit

## 2023-12-22 ENCOUNTER — Other Ambulatory Visit: Payer: Self-pay

## 2023-12-22 DIAGNOSIS — O365930 Maternal care for other known or suspected poor fetal growth, third trimester, not applicable or unspecified: Secondary | ICD-10-CM

## 2023-12-25 ENCOUNTER — Ambulatory Visit: Payer: Self-pay

## 2023-12-25 DIAGNOSIS — Z7681 Expectant parent(s) prebirth pediatrician visit: Secondary | ICD-10-CM

## 2023-12-25 NOTE — Progress Notes (Signed)
 Prenatal counseling for impending newborn done Parent agrees to vaccine and office policies [phone]

## 2024-01-06 ENCOUNTER — Other Ambulatory Visit: Payer: Self-pay

## 2024-01-06 ENCOUNTER — Inpatient Hospital Stay (HOSPITAL_COMMUNITY)

## 2024-01-06 ENCOUNTER — Inpatient Hospital Stay (HOSPITAL_COMMUNITY): Admitting: Anesthesiology

## 2024-01-06 ENCOUNTER — Inpatient Hospital Stay (HOSPITAL_COMMUNITY)
Admission: RE | Admit: 2024-01-06 | Discharge: 2024-01-09 | DRG: 807 | Disposition: A | Discharge status: Home / Self Care | Attending: Obstetrics and Gynecology | Admitting: Obstetrics and Gynecology

## 2024-01-06 ENCOUNTER — Encounter (HOSPITAL_COMMUNITY): Payer: Self-pay | Admitting: Obstetrics and Gynecology

## 2024-01-06 DIAGNOSIS — Z8249 Family history of ischemic heart disease and other diseases of the circulatory system: Secondary | ICD-10-CM

## 2024-01-06 DIAGNOSIS — Z3A36 36 weeks gestation of pregnancy: Secondary | ICD-10-CM

## 2024-01-06 DIAGNOSIS — O365930 Maternal care for other known or suspected poor fetal growth, third trimester, not applicable or unspecified: Secondary | ICD-10-CM | POA: Diagnosis present

## 2024-01-06 DIAGNOSIS — Z148 Genetic carrier of other disease: Secondary | ICD-10-CM | POA: Diagnosis not present

## 2024-01-06 DIAGNOSIS — Z8616 Personal history of COVID-19: Secondary | ICD-10-CM

## 2024-01-06 DIAGNOSIS — J45909 Unspecified asthma, uncomplicated: Secondary | ICD-10-CM | POA: Diagnosis present

## 2024-01-06 DIAGNOSIS — O9952 Diseases of the respiratory system complicating childbirth: Secondary | ICD-10-CM | POA: Diagnosis present

## 2024-01-06 DIAGNOSIS — O365990 Maternal care for other known or suspected poor fetal growth, unspecified trimester, not applicable or unspecified: Principal | ICD-10-CM | POA: Diagnosis present

## 2024-01-06 LAB — TYPE AND SCREEN
ABO/RH(D): O POS
Antibody Screen: NEGATIVE

## 2024-01-06 LAB — CBC
HCT: 37.9 % (ref 36.0–46.0)
Hemoglobin: 12.6 g/dL (ref 12.0–15.0)
MCH: 28.1 pg (ref 26.0–34.0)
MCHC: 33.2 g/dL (ref 30.0–36.0)
MCV: 84.4 fL (ref 80.0–100.0)
Platelets: 167 K/uL (ref 150–400)
RBC: 4.49 MIL/uL (ref 3.87–5.11)
RDW: 13.5 % (ref 11.5–15.5)
WBC: 8.1 K/uL (ref 4.0–10.5)
nRBC: 0 % (ref 0.0–0.2)

## 2024-01-06 LAB — SYPHILIS: RPR W/REFLEX TO RPR TITER AND TREPONEMAL ANTIBODIES, TRADITIONAL SCREENING AND DIAGNOSIS ALGORITHM: RPR Ser Ql: NONREACTIVE

## 2024-01-06 MED ORDER — FENTANYL CITRATE (PF) 100 MCG/2ML IJ SOLN
50.0000 ug | INTRAMUSCULAR | Status: DC | PRN
  Administered 2024-01-06: 50 ug via INTRAVENOUS
  Filled 2024-01-06: qty 2

## 2024-01-06 MED ORDER — FENTANYL CITRATE (PF) 100 MCG/2ML IJ SOLN: 50.0000 ug | Freq: Once | INTRAMUSCULAR | Status: DC

## 2024-01-06 MED ORDER — PHENYLEPHRINE 80 MCG/ML (10ML) SYRINGE FOR IV PUSH (FOR BLOOD PRESSURE SUPPORT): 80.0000 ug | PREFILLED_SYRINGE | INTRAVENOUS | Status: DC | PRN

## 2024-01-06 MED ORDER — OXYTOCIN-SODIUM CHLORIDE 30-0.9 UT/500ML-% IV SOLN
2.5000 [IU]/h | INTRAVENOUS | Status: DC
  Administered 2024-01-07: 2.5 [IU]/h via INTRAVENOUS

## 2024-01-06 MED ORDER — OXYTOCIN-SODIUM CHLORIDE 30-0.9 UT/500ML-% IV SOLN
1.0000 m[IU]/min | INTRAVENOUS | Status: DC
  Administered 2024-01-06: 2 m[IU]/min via INTRAVENOUS
  Filled 2024-01-06: qty 500

## 2024-01-06 MED ORDER — FENTANYL-BUPIVACAINE-NACL 0.5-0.125-0.9 MG/250ML-% EP SOLN
12.0000 mL/h | EPIDURAL | Status: DC | PRN
  Administered 2024-01-06: 12 mL/h via EPIDURAL
  Filled 2024-01-06: qty 250

## 2024-01-06 MED ORDER — LIDOCAINE HCL (PF) 1 % IJ SOLN
INTRAMUSCULAR | Status: DC | PRN
  Administered 2024-01-06: 11 mL via EPIDURAL

## 2024-01-06 MED ORDER — PENICILLIN G POT IN DEXTROSE 60000 UNIT/ML IV SOLN
3.0000 10*6.[IU] | INTRAVENOUS | Status: DC
  Administered 2024-01-06 (×3): 3 10*6.[IU] via INTRAVENOUS
  Filled 2024-01-06 (×3): qty 50

## 2024-01-06 MED ORDER — ACETAMINOPHEN 325 MG PO TABS: 650.0000 mg | ORAL_TABLET | ORAL | Status: DC | PRN

## 2024-01-06 MED ORDER — ONDANSETRON HCL 4 MG/2ML IJ SOLN
4.0000 mg | Freq: Four times a day (QID) | INTRAMUSCULAR | Status: DC | PRN
  Administered 2024-01-06: 4 mg via INTRAVENOUS
  Filled 2024-01-06: qty 2

## 2024-01-06 MED ORDER — DIPHENHYDRAMINE HCL 50 MG/ML IJ SOLN: 12.5000 mg | INTRAMUSCULAR | Status: DC | PRN

## 2024-01-06 MED ORDER — SOD CITRATE-CITRIC ACID 500-334 MG/5ML PO SOLN: 30.0000 mL | ORAL | Status: DC | PRN

## 2024-01-06 MED ORDER — OXYTOCIN BOLUS FROM INFUSION
333.0000 mL | Freq: Once | INTRAVENOUS | Status: AC
  Administered 2024-01-07: 333 mL via INTRAVENOUS

## 2024-01-06 MED ORDER — OXYCODONE-ACETAMINOPHEN 5-325 MG PO TABS: 2.0000 | ORAL_TABLET | ORAL | Status: DC | PRN

## 2024-01-06 MED ORDER — EPHEDRINE 5 MG/ML INJ: 10.0000 mg | INTRAVENOUS | Status: DC | PRN

## 2024-01-06 MED ORDER — SODIUM CHLORIDE 0.9 % IV SOLN
5.0000 10*6.[IU] | Freq: Once | INTRAVENOUS | Status: AC
  Administered 2024-01-06: 5 10*6.[IU] via INTRAVENOUS
  Filled 2024-01-06: qty 5

## 2024-01-06 MED ORDER — LACTATED RINGERS IV SOLN: INTRAVENOUS | Status: DC

## 2024-01-06 MED ORDER — LACTATED RINGERS IV SOLN
500.0000 mL | INTRAVENOUS | Status: DC | PRN
  Administered 2024-01-06: 500 mL via INTRAVENOUS

## 2024-01-06 MED ORDER — LACTATED RINGERS IV SOLN
500.0000 mL | Freq: Once | INTRAVENOUS | Status: AC
  Administered 2024-01-06: 500 mL via INTRAVENOUS

## 2024-01-06 MED ORDER — OXYCODONE-ACETAMINOPHEN 5-325 MG PO TABS: 1.0000 | ORAL_TABLET | ORAL | Status: DC | PRN

## 2024-01-06 MED ORDER — TERBUTALINE SULFATE 1 MG/ML IJ SOLN: 0.2500 mg | Freq: Once | INTRAMUSCULAR | Status: DC | PRN

## 2024-01-06 MED ORDER — LIDOCAINE HCL (PF) 1 % IJ SOLN: 30.0000 mL | INTRAMUSCULAR | Status: DC | PRN

## 2024-01-06 NOTE — H&P (Signed)
 Norma Morris is a 28 y.o. G1P0 female at [redacted]w[redacted]d presenting for medically indicated induction of labor for severe fetal growth restriction initially diagnosed at 23w. In office growth US  yesterday showed EFW 2066g/4lb9oz(<3%), AC <2.3%. 8/8 BPP. Normal UADs. Case discussed with MFM on call and delivery at [redacted]w[redacted]d is indicated given severity of FGR, however given holiday schedule, no medical inductions are available until 1/3, when patient would be [redacted]w[redacted]d. Therefore, will start IOL at [redacted]w[redacted]d given patient G1 and 1cm, as likely to deliver at 37w.   Her pregnancy is otherwise complicated by: -possible h/o LVH/hypertension in childhood: normal ECHO, reviewed with cardiology, no f/u needed. Normotensive throughout pregnancy -asthma: Received prednisone  during pregnancy for asthma flare during viral infection. 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 Social History:  reports that she has never smoked. She has never used smokeless tobacco. She reports that she does not currently use alcohol. She reports that she does not use drugs.     Maternal Diabetes: No Genetic Screening: Normal Maternal Ultrasounds/Referrals: IUGR and Isolated EIF (echogenic intracardiac focus) Fetal Ultrasounds or other Referrals:  Referred to Materal Fetal Medicine  Maternal Substance  Abuse:  No Significant Maternal Medications:  Meds include: Other: albuterol , aspirin Significant Maternal Lab Results:  Other: GBS unknown- collected 12/30 Number of Prenatal Visits:greater than 3 verified prenatal visits Maternal Vaccinations:RSV: Given during pregnancy >/=14 days ago, TDap, Flu, and Covid  Review of Systems  All other systems reviewed and are negative.  Maternal Medical History:  Fetal activity: Perceived fetal activity is normal.   Prenatal complications: IUGR.   No bleeding, cholelithiasis, HIV, PIH, infection, oligohydramnios, placental abnormality, polyhydramnios, pre-eclampsia, preterm labor, substance abuse, thrombocytopenia or thrombophilia.   Prenatal Complications - Diabetes: none.     Blood pressure 129/82, pulse 63, temperature 97.6 F (36.4 C), temperature source Oral, resp. rate 16, height 4' 11 (1.499 m), weight 74.4 kg, last menstrual period 04/23/2023. Maternal Exam:  Abdomen: Estimated fetal weight is 5lb.   Fetal presentation: vertex Introitus: Normal vulva. Pelvis: adequate for delivery.     Fetal Exam Fetal Monitor Review: Baseline rate: 125.  Variability: moderate (6-25 bpm).   Pattern: accelerations present and no decelerations.   Fetal State Assessment: Category I - tracings are normal.   Physical Exam Vitals reviewed. Exam conducted with a chaperone present.  Constitutional:      Appearance: Normal appearance.  HENT:     Head: Normocephalic.  Eyes:     Extraocular Movements: Extraocular movements intact.  Cardiovascular:     Comments:  Well perfused Pulmonary:     Effort: Pulmonary effort is normal.  Abdominal:     Comments: Non-tender, gravid  Genitourinary:    General: Normal vulva.  Musculoskeletal:        General: Normal range of motion.     Cervical back: Normal range of motion.  Skin:    General: Skin is warm and dry.  Neurological:     General: No focal deficit present.     Mental Status: She is alert and  oriented to person, place, and time.  Psychiatric:        Mood and Affect: Mood normal.        Behavior: Behavior normal.        Thought Content: Thought content normal.        Judgment: Judgment normal.     Prenatal labs: ABO, Rh: --/--/O POS (12/31 0750) Antibody: NEG (12/31 0750) Rubella: Immune (09/19 0000) RPR: Nonreactive (09/19 0000)  HBsAg: Negative (09/19 0000)  HIV: Non-reactive (09/19 0000)  GBS:   GBS unknown- collected 12/30  Assessment/Plan: Norma Morris is a 28 y.o. G1P0 female at [redacted]w[redacted]d presenting for medically indicated induction of labor for severe fetal growth restriction -mIOL for FGR: SVE 1/50/-3. Cooks foley balloon placed with IV pain medication, 80/20 at approximately 8am. Will start pitocin at 12pm or when balloon comes out if sooner. Epidural PRN per patient desire. -FWB: Cat 1.  -GBS unk: given late preterm, PCN ordered. -possible h/o LVH/hypertension in childhood: normal ECHO, reviewed with cardiology, no f/u needed. Normotensive throughout pregnancy -asthma: Received prednisone  during pregnancy for asthma flare during viral infection. PRN albuterol  -echogenic intracardiac focus of fetal heart -alpha thalassemia carrier -recent Covid infection: dx on 12/16, treated with Paxlovid   Dispo: anticipate SVD.   Norma Morris 01/06/2024, 9:33 AM

## 2024-01-06 NOTE — Progress Notes (Signed)
**Note De-Identified Norma Obfuscation**  Norma Morris is a 28 y.o. G1P0 female at [redacted]w[redacted]d undergoing medically indicated induction of labor for severe fetal growth restriction   Subjective: In severe pain with contractions, using nitrous oxide, requesting epidural.  Objective:    01/06/2024    6:40 PM 01/06/2024    5:05 PM 01/06/2024    2:56 PM  Vitals with BMI  Systolic 134 145 871  Diastolic 93 83 72  Pulse 92 71 81     FHT:  FHR: 130 bpm, variability: moderate,  accelerations:  Present,  decelerations:  Present none UC:   difficult to determine on toco; on observation q 2-83minutes SVE:   Dilation: 7 Effacement (%): 90 Station: -2 Exam by:: Eduardo Honor  Labs: Lab Results  Component Value Date   WBC 8.1 01/06/2024   HGB 12.6 01/06/2024   HCT 37.9 01/06/2024   MCV 84.4 01/06/2024   PLT 167 01/06/2024    Assessment / Plan: Norma Morris is a 28 y.o. G1P0 female at [redacted]w[redacted]d undergoing medically indicated induction of labor for severe fetal growth restriction -mIOL for FGR: s/p foley balloon and AROM (moderate amount, clear) at 19:20. On 12mu/min of pitocin. Patient now desires epidural placement. -FWB: Cat 1, intermittently Cat 2 for variables -GBS unk: given late preterm, PCN ordered, s/p multiple doses. -possible h/o LVH/hypertension in childhood: normal ECHO, reviewed with cardiology, no f/u needed. Normotensive throughout pregnancy -elevated Bps: one on presentation and three during un-medicated labor, while patient in pain since circumstantial, will not currently rule in for gHTN; monitor.  -asthma: Received prednisone  during pregnancy for asthma flare during viral infection. PRN albuterol  -echogenic intracardiac focus of fetal heart -alpha thalassemia carrier -recent Covid infection: dx on 12/16, treated with Paxlovid      Dispo: anticipate SVD.   Norma DELENA Husky, MD 01/06/2024, 9:36 PM

## 2024-01-06 NOTE — Progress Notes (Signed)
 Norma Morris is a 28 y.o. G1P0 female at [redacted]w[redacted]d undergoing medically indicated induction of labor for severe fetal growth restriction   Subjective: In pain, being continuously supported by her doula  Objective:    01/06/2024    6:40 PM 01/06/2024    5:05 PM 01/06/2024    2:56 PM  Vitals with BMI  Systolic 134 145 871  Diastolic 93 83 72  Pulse 92 71 81     FHT:  FHR: 130 bpm, variability: moderate,  accelerations:  Present,  decelerations:  Present intermittent variables UC:   difficult to determine on toco; on observation q 2-38minutes SVE:   Dilation: 7 Effacement (%): 70 Station: -2 Exam by:: Edra Riccardi MD  Labs: Lab Results  Component Value Date   WBC 8.1 01/06/2024   HGB 12.6 01/06/2024   HCT 37.9 01/06/2024   MCV 84.4 01/06/2024   PLT 167 01/06/2024    Assessment / Plan: Norma Morris is a 27 y.o. G1P0 female at [redacted]w[redacted]d undergoing medically indicated induction of labor for severe fetal growth restriction -mIOL for FGR: s/p foley balloon and AROM (moderate amount, clear) at 19:20. On 12mu/min of pitocin. Epidural PRN per patient desire, however patient plans to go without; currently managing with doula support -FWB: Cat 2, for intermittent variables, but overall reassuring. Encouraged position changes. -GBS unk: given late preterm, PCN ordered, s/p multiple doses. -possible h/o LVH/hypertension in childhood: normal ECHO, reviewed with cardiology, no f/u needed. Normotensive throughout pregnancy -elevated Bps: one on presentation and three during un-medicated labor, while patient in pain since circumstantial, will not currently rule in for gHTN; monitor.  -asthma: Received prednisone  during pregnancy for asthma flare during viral infection. PRN albuterol  -echogenic intracardiac focus of fetal heart -alpha thalassemia carrier -recent Covid infection: dx on 12/16, treated with Paxlovid      Dispo: anticipate SVD.   Norma DELENA Husky, MD 01/06/2024, 7:37 PM

## 2024-01-06 NOTE — Anesthesia Preprocedure Evaluation (Signed)
"                                    Anesthesia Evaluation  Patient identified by MRN, date of birth, ID band Patient awake    Reviewed: Allergy & Precautions, H&P , NPO status , Patient's Chart, lab work & pertinent test results  Airway Mallampati: II  TM Distance: >3 FB Neck ROM: Full    Dental no notable dental hx.    Pulmonary asthma    Pulmonary exam normal breath sounds clear to auscultation       Cardiovascular hypertension, negative cardio ROS Normal cardiovascular exam Rhythm:Regular Rate:Normal     Neuro/Psych negative neurological ROS  negative psych ROS   GI/Hepatic negative GI ROS, Neg liver ROS,,,  Endo/Other  negative endocrine ROS    Renal/GU negative Renal ROS  negative genitourinary   Musculoskeletal negative musculoskeletal ROS (+)    Abdominal   Peds negative pediatric ROS (+)  Hematology negative hematology ROS (+)   Anesthesia Other Findings   Reproductive/Obstetrics (+) Pregnancy                              Anesthesia Physical Anesthesia Plan  ASA: 2  Anesthesia Plan: Epidural   Post-op Pain Management:    Induction:   PONV Risk Score and Plan:   Airway Management Planned:   Additional Equipment:   Intra-op Plan:   Post-operative Plan:   Informed Consent:   Plan Discussed with:   Anesthesia Plan Comments:         Anesthesia Quick Evaluation  "

## 2024-01-06 NOTE — Anesthesia Procedure Notes (Signed)
 Epidural Patient location during procedure: OB Start time: 01/06/2024 9:54 PM End time: 01/06/2024 10:10 PM  Staffing Anesthesiologist: Cleotilde Butler Dade, MD Performed: anesthesiologist   Preanesthetic Checklist Completed: patient identified, IV checked, site marked, risks and benefits discussed, surgical consent, monitors and equipment checked, pre-op evaluation and timeout performed  Epidural Patient position: sitting Prep: ChloraPrep Patient monitoring: heart rate, cardiac monitor, continuous pulse ox and blood pressure Approach: midline Location: L2-L3 Injection technique: LOR saline  Needle:  Needle type: Tuohy  Needle gauge: 17 G Needle length: 9 cm Needle insertion depth: 6 cm Catheter type: closed end flexible Catheter size: 20 Guage Catheter at skin depth: 10 cm Test dose: negative  Assessment Events: blood not aspirated, injection not painful, no injection resistance, no paresthesia and negative IV test  Additional Notes Reason for block:procedure for pain

## 2024-01-06 NOTE — Progress Notes (Signed)
 Norma Morris is a 28 y.o. G1P0 female at [redacted]w[redacted]d undergoing medically indicated induction of labor for severe fetal growth restriction   Subjective: Doing ok, feeling crampy.   Objective:    01/06/2024    2:56 PM 01/06/2024    1:37 PM 01/06/2024    1:02 PM  Vitals with BMI  Systolic 128 123 860  Diastolic 72 68 92  Pulse 81 73 85     FHT:  FHR: 140 bpm, variability: moderate,  accelerations:  Present,  decelerations:  Present variable UC:   difficult to determine on toco SVE:   Dilation: (P) 6 Effacement (%): (P) 70 Station: (P) -3 Exam by:: JERENE Husky, MD  Labs: Lab Results  Component Value Date   WBC 8.1 01/06/2024   HGB 12.6 01/06/2024   HCT 37.9 01/06/2024   MCV 84.4 01/06/2024   PLT 167 01/06/2024    Assessment / Plan: Norma Morris is a 28 y.o. G1P0 female at [redacted]w[redacted]d undergoing medically indicated induction of labor for severe fetal growth restriction -mIOL for FGR: s/p foley balloon. On 57mu/min of pitocin. AROM deferred given intermittent variables and fetal station. Will re-assess on next exam. Epidural PRN per patient desire, however patient plans to go without.  -FWB: Cat 2, for intermittent variables, but overall reassuring. Encouraged position changes. -GBS unk: given late preterm, PCN ordered, s/p multiple doses. -possible h/o LVH/hypertension in childhood: normal ECHO, reviewed with cardiology, no f/u needed. Normotensive throughout pregnancy -elevated Bps: one on presentation and one during un-medicated labor; since circumstantial, will not currently rule in for gHTN; monitor.  -asthma: Received prednisone  during pregnancy for asthma flare during viral infection. PRN albuterol  -echogenic intracardiac focus of fetal heart -alpha thalassemia carrier -recent Covid infection: dx on 12/16, treated with Paxlovid      Dispo: anticipate SVD.   Norma DELENA Husky, MD 01/06/2024, 4:39 PM

## 2024-01-07 ENCOUNTER — Encounter (HOSPITAL_COMMUNITY): Payer: Self-pay | Admitting: Obstetrics and Gynecology

## 2024-01-07 LAB — CBC
HCT: 32.2 % — ABNORMAL LOW (ref 36.0–46.0)
Hemoglobin: 10.8 g/dL — ABNORMAL LOW (ref 12.0–15.0)
MCH: 28.3 pg (ref 26.0–34.0)
MCHC: 33.5 g/dL (ref 30.0–36.0)
MCV: 84.5 fL (ref 80.0–100.0)
Platelets: 167 K/uL (ref 150–400)
RBC: 3.81 MIL/uL — ABNORMAL LOW (ref 3.87–5.11)
RDW: 13.6 % (ref 11.5–15.5)
WBC: 15.1 K/uL — ABNORMAL HIGH (ref 4.0–10.5)
nRBC: 0 % (ref 0.0–0.2)

## 2024-01-07 LAB — COMPREHENSIVE METABOLIC PANEL WITH GFR
ALT: 49 U/L — ABNORMAL HIGH (ref 0–44)
AST: 32 U/L (ref 15–41)
Albumin: 3.4 g/dL — ABNORMAL LOW (ref 3.5–5.0)
Alkaline Phosphatase: 202 U/L — ABNORMAL HIGH (ref 38–126)
Anion gap: 12 (ref 5–15)
BUN: <5 mg/dL — ABNORMAL LOW (ref 6–20)
CO2: 20 mmol/L — ABNORMAL LOW (ref 22–32)
Calcium: 9.2 mg/dL (ref 8.9–10.3)
Chloride: 105 mmol/L (ref 98–111)
Creatinine, Ser: 0.62 mg/dL (ref 0.44–1.00)
GFR, Estimated: >60 mL/min
Glucose, Bld: 163 mg/dL — ABNORMAL HIGH (ref 70–99)
Potassium: 3.9 mmol/L (ref 3.5–5.1)
Sodium: 137 mmol/L (ref 135–145)
Total Bilirubin: 0.3 mg/dL (ref 0.0–1.2)
Total Protein: 5.8 g/dL — ABNORMAL LOW (ref 6.5–8.1)

## 2024-01-07 MED ORDER — IBUPROFEN 600 MG PO TABS
600.0000 mg | ORAL_TABLET | Freq: Four times a day (QID) | ORAL | Status: DC
  Administered 2024-01-07 – 2024-01-09 (×9): 600 mg via ORAL
  Filled 2024-01-07 (×9): qty 1

## 2024-01-07 MED ORDER — WITCH HAZEL-GLYCERIN EX PADS: 1.0000 | MEDICATED_PAD | CUTANEOUS | Status: DC | PRN

## 2024-01-07 MED ORDER — BENZOCAINE-MENTHOL 20-0.5 % EX AERO: 1.0000 | INHALATION_SPRAY | CUTANEOUS | Status: DC | PRN

## 2024-01-07 MED ORDER — ONDANSETRON HCL 4 MG/2ML IJ SOLN: 4.0000 mg | INTRAMUSCULAR | Status: DC | PRN

## 2024-01-07 MED ORDER — PRENATAL MULTIVITAMIN CH
1.0000 | ORAL_TABLET | Freq: Every day | ORAL | Status: DC
  Administered 2024-01-07 – 2024-01-09 (×3): 1 via ORAL
  Filled 2024-01-07 (×3): qty 1

## 2024-01-07 MED ORDER — SENNOSIDES-DOCUSATE SODIUM 8.6-50 MG PO TABS
2.0000 | ORAL_TABLET | Freq: Every day | ORAL | Status: DC
  Administered 2024-01-08 – 2024-01-09 (×2): 2 via ORAL
  Filled 2024-01-07 (×2): qty 2

## 2024-01-07 MED ORDER — ONDANSETRON HCL 4 MG PO TABS: 4.0000 mg | ORAL_TABLET | ORAL | Status: DC | PRN

## 2024-01-07 MED ORDER — DIPHENHYDRAMINE HCL 25 MG PO CAPS: 25.0000 mg | ORAL_CAPSULE | Freq: Four times a day (QID) | ORAL | Status: DC | PRN

## 2024-01-07 MED ORDER — SIMETHICONE 80 MG PO CHEW: 80.0000 mg | CHEWABLE_TABLET | ORAL | Status: DC | PRN

## 2024-01-07 MED ORDER — COCONUT OIL OIL: 1.0000 | TOPICAL_OIL | Status: DC | PRN

## 2024-01-07 MED ORDER — TETANUS-DIPHTH-ACELL PERTUSSIS 5-2-15.5 LF-MCG/0.5 IM SUSP: 0.5000 mL | Freq: Once | INTRAMUSCULAR | Status: DC

## 2024-01-07 MED ORDER — ZOLPIDEM TARTRATE 5 MG PO TABS: 5.0000 mg | ORAL_TABLET | Freq: Every evening | ORAL | Status: DC | PRN

## 2024-01-07 MED ORDER — DIBUCAINE (PERIANAL) 1 % EX OINT: 1.0000 | TOPICAL_OINTMENT | CUTANEOUS | Status: DC | PRN

## 2024-01-07 MED ORDER — ACETAMINOPHEN 325 MG PO TABS
650.0000 mg | ORAL_TABLET | ORAL | Status: DC | PRN
  Administered 2024-01-07 – 2024-01-08 (×2): 650 mg via ORAL
  Filled 2024-01-07 (×2): qty 2

## 2024-01-07 NOTE — Lactation Note (Signed)
 This note was copied from a baby's chart. Lactation Consultation Note  Patient Name: Norma Morris Unijb'd Date: 01/07/2024 Age:29 hours  Consult attempted.  Family sleeping.  Lactation to follow up later today.   Feeding Nipple Type: Extra Slow Flow   Shannon Levorn Lemme  RN, IBCLC 01/07/2024, 9:45 AM

## 2024-01-07 NOTE — Progress Notes (Signed)
 Post Partum Day 0/1 Subjective: no complaints, up ad lib, voiding, and tolerating PO  Objective: Blood pressure 110/67, pulse 67, temperature 97.7 F (36.5 C), temperature source Oral, resp. rate 17, height 4' 11 (1.499 m), weight 74.4 kg, last menstrual period 04/23/2023, SpO2 97%, unknown if currently breastfeeding.  Physical Exam:  General: alert, cooperative, and appears stated age Lochia: appropriate Uterine Fundus: firm DVT Evaluation: No evidence of DVT seen on physical exam.  Recent Labs    01/06/24 0750 01/07/24 0440  HGB 12.6 10.8*  HCT 37.9 32.2*    Assessment/Plan: Breastfeeding Desires neonatal circumcision, R/B/A of procedure discussed at length. Pt understands that neonatal circumcision is not considered medically necessary and is elective. The risks include, but are not limited to bleeding, infection, damage to the penis, development of scar tissue, and having to have it redone at a later date. Pt understands theses risks and wishes to proceed. Baby to young to do today, defer till tomorrow   LOS: 1 day   Marjorie Gull, MD 01/07/2024, 2:45 PM

## 2024-01-07 NOTE — Anesthesia Postprocedure Evaluation (Signed)
"   Anesthesia Post Note  Patient: Norma Morris  Procedure(s) Performed: AN AD HOC LABOR EPIDURAL     Patient location during evaluation: Mother Baby Anesthesia Type: Epidural Level of consciousness: awake and alert and oriented Pain management: satisfactory to patient Vital Signs Assessment: post-procedure vital signs reviewed and stable Respiratory status: respiratory function stable Cardiovascular status: stable Postop Assessment: no headache, no backache, epidural receding, patient able to bend at knees, no signs of nausea or vomiting, adequate PO intake and able to ambulate Anesthetic complications: no   No notable events documented.  Last Vitals:  Vitals:   01/07/24 0317 01/07/24 0730  BP: 127/80 110/66  Pulse: 73 (!) 51  Resp: 18 16  Temp: 37 C 36.9 C  SpO2: 98%     Last Pain:  Vitals:   01/07/24 0832  TempSrc:   PainSc: Asleep   Pain Goal:                   Elison Worrel      "

## 2024-01-07 NOTE — Lactation Note (Signed)
 This note was copied from a baby's chart. Lactation Consultation Note  Patient Name: Norma Morris Unijb'd Date: 01/07/2024 Age:29 hours   LC delivered MOB, STORK DEBP (Spectra ).  Maternal Data    Feeding Nipple Type: Extra Slow Flow  LATCH Score                    Lactation Tools Discussed/Used    Interventions    Discharge    Consult Status      [name] 01/07/2024, 6:07 PM

## 2024-01-07 NOTE — L&D Delivery Note (Signed)
 Delivery Note Norma Morris is a 29 y.o. G1 now P1001 who had a spontaneous delivery [redacted]w[redacted]d after IOL for severe FGR.  At 12:01am a healthy baby boy was delivered via  (Presentation: vertex, OA ).  APGAR: 8, 9; weight 4lb10oz .     Admitted and underwent induction of labor for severe FGR. Induced with foley balloon, AROM, and pitocin. Progressed normally. Received epidural for pain management. Pushed for one contraction. Baby was delivered without difficulty. No nuchal cord.  Baby had good tone and cry and was placed on the maternal abdomen where routine stimulation and bulb suction was performed. Delayed cord clamping for 60 seconds. I clamped the cord and [name] cut it. Delivery of placenta was spontaneous. Cord blood was collected. Placenta was found to be small, intact, 3-vessel cord was noted. The fundus was found to be firm. No lacerations. Estimated blood loss 162cc. Cord gases not sent. Instrument and gauze counts were correct at the end of the procedure. Fundus was firm, bleeding was minimal, and both mother and baby were doing well when I left the room. Of note, NICU MD weighed baby, who met weight criteria for remaining in maternal care rather than transfer to the NICU.   Placenta status: small, intact, to pathology .   Cord: 3-vessel  Anesthesia:  epidural Episiotomy:  none Lacerations:  none Suture Repair: none Est. Blood Loss (mL):  162cc Baby boy: parents desire neonatal circumcision, orders in.  Mom to postpartum.  Baby to Couplet care / Skin to Skin.  Cana Mignano A Hawkins Seaman 01/07/2024, 12:24 AM

## 2024-01-07 NOTE — Lactation Note (Addendum)
 This note was copied from a baby's chart. Lactation Consultation Note  Patient Name: Norma Morris Date: 01/07/2024 Age:29 hours Reason for consult: Initial assessment;1st time breastfeeding;Early term 37-38.6wks;Infant < 5lbs  P1, Baby 37 weeks.  < 5 lbs. Mother states Koren has been sleepy and has not fed recently. LC assisted with bottle feeding in side lying position using paced feeding. Baby consumed 10 ml with slow flow nipple. Provided extra slow flow nipple to try with next feeding.  Set up DEBP with 21 mm flanges.  Sent Stork pump referral.  Mother pumped drops. Reviewed hand expresssion with drops expressed.   Plan: Offer breast when baby cues that he is hungry, or awaken baby for feeding at 3 hrs. Mother may attempt breastfeeding when baby is alert and awake, asking for help prn.  If baby latches, breastfeeding should be limited to 5-10 min.  Limit total feeding time to 30 mins so not to overtire baby. If baby does not latch after 5-10 min of attempt - give supplemental breastmilk/formula.   Pump both breasts 15 minutes on initiation setting, adding hand expression to collect as much colostrum as possible to feed baby.  Feed baby 10-11 ml EBM+/formula after breastfeeding per LPTI volume guidelines increasing per day of life and as baby desires.   Maternal Data Has patient been taught Hand Expression?: Yes Does the patient have breastfeeding experience prior to this delivery?: No  Feeding Mother's Current Feeding Choice: Breast Milk and Formula Nipple Type: Slow - flow  LLactation Tools Discussed/Used Tools: Pump;Flanges Flange Size: 21 Breast pump type: Double-Electric Breast Pump;Manual Pump Education: Milk Storage;Setup, frequency, and cleaning Reason for Pumping: stimulation and supplementation Pumping frequency: q 3 hours for 15 min  Interventions Interventions: Breast feeding basics reviewed;DEBP;Education;Pace feeding;LC Services brochure;CDC  milk storage guidelines  Discharge Pump: Referral sent for [facility] Pump  Consult Status Consult Status: Follow-up Date: 01/08/24 Follow-up type: In-patient  Shannon Levorn Lemme  RN, IBCLC 01/07/2024, 1:58 PM

## 2024-01-08 NOTE — Progress Notes (Signed)
 Pt left unit to visit infant in nicu.

## 2024-01-08 NOTE — Progress Notes (Signed)
 Post Partum Day 1 Subjective: up ad lib, voiding, tolerating PO, and + flatus Pain meds help with relief . She denies CP, SOB or HA. She is pumping and supplementing with formula. Baby boy in NICU - working on temp control and feeds. Pt and husband coping well ; in good spirits. Reports lochia mild. No complaints. She would like to stay till tomorrow given baby not ready for discharge yet   Objective: Blood pressure 123/74, pulse 61, temperature 97.6 F (36.4 C), temperature source Oral, resp. rate 18, height 4' 11 (1.499 m), weight 74.4 kg, last menstrual period 04/23/2023, SpO2 100%, unknown if currently breastfeeding.  Physical Exam:  General: alert, cooperative, and no distress Lochia: appropriate Uterine Fundus: firm Incision: n/a DVT Evaluation: No evidence of DVT seen on physical exam.  Recent Labs    01/06/24 0750 01/07/24 0440  HGB 12.6 10.8*  HCT 37.9 32.2*    Assessment/Plan: Plan for discharge tomorrow Plan for circumcision for baby prior to him getting discharged.  Routine pp care today    LOS: 2 days   Sanjuan Sawa W Kazuto Sevey, DO 01/08/2024, 10:27 AM

## 2024-01-08 NOTE — Social Work (Signed)
 Patient screened out for psychosocial assessment since none of the following apply: Psychosocial stressors documented in mother or baby's chart Gestation less than 32 weeks Code at delivery  Infant with anomalies Please contact the Clinical Social Worker if specific needs arise, by MOB's request, or if MOB scores greater than 9/yes to question 10 on Edinburgh Postpartum Depression Screen.  Nat Quiet, MSW, LCSW Clinical Social Worker  [phone] 01/08/2024  9:39 AM

## 2024-01-09 MED ORDER — IBUPROFEN 800 MG PO TABS: 800.0000 mg | ORAL_TABLET | Freq: Three times a day (TID) | ORAL | 1 refills | Status: AC | PRN

## 2024-01-09 NOTE — Lactation Note (Signed)
 This note was copied from a baby's chart.  NICU Lactation Consultation Note  Patient Name: Boy Analyah Mcconnon Unijb'd Date: 01/09/2024 Age:29 hours  Reason for consult: Follow-up assessment; Primapara; 1st time breastfeeding; NICU baby; Infant < 5lbs; Early term 51-38.6wks; Maternal discharge; Other (Comment) (IUGR, SGA)  SUBJECTIVE Visited with family of 58 71/77 weeks old NICU female; baby Koren got admitted due to hypothermia. Ms. Jezewski is a P1 and reported she's been pumping and getting small amounts of colostrum, enough to collect, praised her for all her efforts. Noticed that pumping hasn't been consistent. She said it's been too hectic to establish a pumping schedule. Suggested to set up alarms in her phone to pump around baby's feeding times or every 3 hours, he's on ad lib status.   [facility]'s getting [facility] today. Reviewed discharge education, pump settings, pumping schedule, pumping log and the importance of consistent pumping for the onset of secretory activation and the prevention of engorgement. Discussed getting inserts for her Spectra  Stork pump.  OBJECTIVE Infant data: Mother's Current Feeding Choice: Breast Milk and Formula  O2 Device: Room Air  Infant feeding assessment IDFS - Readiness: 2 (Nursing reporting infant not really waking up on his own -3 but did eventually latch with quick fatigue) IDFS - Quality: 4 (very sleepy)   Maternal data: G1P1001 Vaginal, Spontaneous Significant Breast History:: (+++) breast changes during the pregnancy Current breast feeding challenges:: NICU admission Pumping frequency: 4 times/24 hours Pumped volume: 2 mL Risk factor for low/delayed milk supply:: primipara, infant separation, SGA, < 5 lbs  Pump: Received Stork Pump (Spectra  S2 plus)  ASSESSMENT Infant: Feeding Status: Ad lib Feeding method: Bottle Nipple Type: Nfant Slow Flow (purple) (trialed DBP but infant alseep)  Maternal: Milk volume:  Normal  INTERVENTIONS/PLAN Interventions: Interventions: Breast feeding basics reviewed; DEBP; Education; NICU Pumping Log Discharge Education: Engorgement and breast care Tools: Pump  Plan: STS around care times Pump both breasts on initiate mode every 3 hours for 15 minutes; ideally 8 pumping sessions/24 hours Switch to maintain mode once expressing +20 ml of EBM combined or by day 5; whichever happens first Bring all pump pieces to baby's room after her discharge Family will continue advancing on PO feedings  No other support person at this time. All questions and concerns answered, family to contact [facility] services PRN.  Consult Status: NICU follow-up NICU Follow-up type: Verify onset of copious milk; Verify absence of engorgement   Jeralynn Vaquera S Lavender Stanke 01/09/2024, 2:19 PM

## 2024-01-09 NOTE — Progress Notes (Addendum)
 Post Partum Day 2 Subjective: Attempted to round/discharge patient however both she and husband were soundly sleeping upon entry to room. Baby boy still in NICU, breast pump present in room  Objective: Blood pressure 130/76, pulse 65, temperature 97.7 F (36.5 C), temperature source Oral, resp. rate 19, height 4' 11 (1.499 m), weight 74.4 kg, last menstrual period 04/23/2023, SpO2 99%, unknown if currently breastfeeding.   Recent Labs    01/07/24 0440  HGB 10.8*  HCT 32.2*    Assessment/Plan: Discharge home today, will attempt to round again in a few hours   LOS: 3 days   Lavonia CHRISTELLA Guppy, MD 01/09/2024, 9:13 AM   ADDENDUM: seen at bedside, meeting postpartum goals, pumping for baby and supplementing. Advised NICU needs to clear prior to circ for baby boy. DC home with routine f/u. Fundus firm 3cm below umbilicus BP 130/76 (BP Location: Left Arm)   Pulse 65   Temp 97.7 F (36.5 C) (Oral)   Resp 19   Ht 4' 11 (1.499 m)   Wt 74.4 kg   LMP 04/23/2023   SpO2 99%   Breastfeeding Unknown   BMI 33.12 kg/m

## 2024-01-09 NOTE — Anesthesia Postprocedure Evaluation (Signed)
"   Anesthesia Post Note  Patient: TREANA LACOUR  Procedure(s) Performed: AN AD HOC LABOR EPIDURAL     Patient location during evaluation: Mother Baby Anesthesia Type: Epidural Level of consciousness: awake and alert and oriented Pain management: satisfactory to patient Vital Signs Assessment: post-procedure vital signs reviewed and stable Respiratory status: respiratory function stable Cardiovascular status: stable Postop Assessment: no headache, no backache, epidural receding, patient able to bend at knees, no signs of nausea or vomiting, adequate PO intake and able to ambulate Anesthetic complications: no   No notable events documented.  Last Vitals:  Vitals:   01/08/24 2023 01/09/24 0526  BP: 126/63 130/76  Pulse: (!) 58 65  Resp: 18 19  Temp: 36.5 C   SpO2: 99% 99%    Last Pain:  Vitals:   01/09/24 0527  TempSrc:   PainSc: 0-No pain   Pain Goal:                   Joya Willmott      "

## 2024-01-09 NOTE — Discharge Summary (Signed)
 "    Postpartum Discharge Summary  Date of Service updated     Patient Name: Norma Morris DOB: [birth year] MRN: [id number]  Date of admission: 01/06/2024 Delivery date:01/07/2024 Delivering provider: CLAIRE RAMAN A Date of discharge: 01/09/2024  Admitting diagnosis: Fetal growth restriction antepartum [O36.5990] Intrauterine pregnancy: [redacted]w[redacted]d     Secondary diagnosis:  Principal Problem:   Fetal growth restriction antepartum  Additional problems: none    Discharge diagnosis: Term Pregnancy Delivered                                              Post partum procedures:none Augmentation: AROM, Pitocin , and IP Foley Complications: None  Hospital course: Induction of Labor With Vaginal Delivery   29 y.o. yo G1P1001 at [redacted]w[redacted]d was admitted to the hospital 01/06/2024 for induction of labor.  Indication for induction: FGR.  Patient had an labor course complicated by none Membrane Rupture Time/Date: 7:20 PM,01/06/2024  Delivery Method:Vaginal, Spontaneous Operative Delivery:N/A Episiotomy: None Lacerations:  None Details of delivery can be found in separate delivery note.  Patient had a postpartum course complicated by none, baby boy in NICU at time of discharge. Patient is discharged home 01/09/2024.  Newborn Data: Birth date:01/07/2024 Birth time:12:01 AM Gender:Female Living status:Living Apgars:8 ,9  Weight:2100 g   Immunizations administered: Immunization History  Administered Date(s) Administered   PFIZER(Purple Top)SARS-COV-2 Vaccination 04/23/2019, 05/26/2019, 12/27/2019   Pfizer Covid-19 Vaccine Bivalent Booster 22yrs & up 09/21/2020    Physical exam  Vitals:   01/08/24 0541 01/08/24 0834 01/08/24 2023 01/09/24 0526  BP: 128/83 123/74 126/63 130/76  Pulse: 60 61 (!) 58 65  Resp: 18 18 18 19   Temp: 97.9 F (36.6 C) 97.6 F (36.4 C) 97.7 F (36.5 C)   TempSrc: Oral Oral Oral Oral  SpO2: 99% 100% 99% 99%  Weight:      Height:       General: alert, cooperative, and  no distress Lochia: appropriate Uterine Fundus: firm Incision: N/A DVT Evaluation: No evidence of DVT seen on physical exam. Negative Homan's sign. No cords or calf tenderness. Labs: Lab Results  Component Value Date   WBC 15.1 (H) 01/07/2024   HGB 10.8 (L) 01/07/2024   HCT 32.2 (L) 01/07/2024   MCV 84.5 01/07/2024   PLT 167 01/07/2024      Latest Ref Rng & Units 01/07/2024    4:40 AM  CMP  Glucose 70 - 99 mg/dL 836   BUN 6 - 20 mg/dL <5   Creatinine 9.55 - 1.00 mg/dL 9.37   Sodium 864 - 854 mmol/L 137   Potassium 3.5 - 5.1 mmol/L 3.9   Chloride 98 - 111 mmol/L 105   CO2 22 - 32 mmol/L 20   Calcium 8.9 - 10.3 mg/dL 9.2   Total Protein 6.5 - 8.1 g/dL 5.8   Total Bilirubin 0.0 - 1.2 mg/dL 0.3   Alkaline Phos 38 - 126 U/L 202   AST 15 - 41 U/L 32   ALT 0 - 44 U/L 49    Edinburgh Score:    01/07/2024    3:18 AM  Edinburgh Postnatal Depression Scale Screening Tool  I have been able to laugh and see the funny side of things. 0  I have looked forward with enjoyment to things. 0  I have blamed myself unnecessarily when things went wrong. 1  I have been anxious or  worried for no good reason. 1  I have felt scared or panicky for no good reason. 1  Things have been getting on top of me. 1  I have been so unhappy that I have had difficulty sleeping. 0  I have felt sad or miserable. 2  I have been so unhappy that I have been crying. 0  The thought of harming myself has occurred to me. 0  Edinburgh Postnatal Depression Scale Total 6      After visit meds:  Allergies as of 01/09/2024       Reactions   Aspirin Nausea Only   Other Reaction(s): stomach upset   Doxycycline Nausea And Vomiting   Other Reaction(s): Not available, vomiting, nausea, stomach pain doxycycline   Norgestimate-eth Estradiol    Other Reaction(s): numbness on right side and headaches   Shellfish Allergy Itching   Other Reaction(s): facial swelling   Shellfish Protein-containing Drug Products    Other  Reaction(s): throat itching   Cyclobenzaprine  Rash, Dermatitis, Hives        Medication List     STOP taking these medications    azithromycin  250 MG tablet Commonly known as: ZITHROMAX    budesonide -formoterol  160-4.5 MCG/ACT inhaler Commonly known as: SYMBICORT    cephALEXin  500 MG capsule Commonly known as: Keflex    clindamycin 300 MG capsule Commonly known as: CLEOCIN   clotrimazole  1 % cream Commonly known as: LOTRIMIN    clotrimazole -betamethasone  cream Commonly known as: LOTRISONE    doxycycline 100 MG capsule Commonly known as: VIBRAMYCIN   methocarbamol 750 MG tablet Commonly known as: ROBAXIN   metroNIDAZOLE  500 MG tablet Commonly known as: FLAGYL    misoprostol 100 MCG tablet Commonly known as: CYTOTEC   naproxen  375 MG tablet Commonly known as: NAPROSYN    naproxen  500 MG tablet Commonly known as: NAPROSYN    ondansetron  4 MG disintegrating tablet Commonly known as: ZOFRAN -ODT   ondansetron  4 MG tablet Commonly known as: Zofran    predniSONE  20 MG tablet Commonly known as: DELTASONE    sulfamethoxazole -trimethoprim  800-160 MG tablet Commonly known as: BACTRIM  DS   terbinafine 250 MG tablet Commonly known as: LAMISIL   Tessalon  Perles 100 MG capsule Generic drug: benzonatate    triamcinolone  cream 0.1 % Commonly known as: KENALOG        TAKE these medications    Albuterol  Sulfate 108 (90 Base) MCG/ACT Aepb Commonly known as: PROAIR  RESPICLICK 1 puff as needed Inhalation every 4 hrs   albuterol  108 (90 Base) MCG/ACT inhaler Commonly known as: VENTOLIN  HFA Inhale 1-2 puffs into the lungs every 6 (six) hours as needed for wheezing or shortness of breath.   albuterol  108 (90 Base) MCG/ACT inhaler Commonly known as: VENTOLIN  HFA Inhale 2 puffs into the lungs every 6 (six) hours as needed for wheezing or shortness of breath.   fluticasone  50 MCG/ACT nasal spray Commonly known as: FLONASE  Place 1 spray into both nostrils daily.    ibuprofen  800 MG tablet Commonly known as: ADVIL  Take 1 tablet (800 mg total) by mouth every 8 (eight) hours as needed.   Prenatal Adult Gummy/DHA/FA 0.4-25 MG Chew as directed Orally         Discharge home in stable condition Infant Feeding: Bottle and Breast Infant Disposition:NICU Discharge instruction: per After Visit Summary and Postpartum booklet. Activity: Advance as tolerated. Pelvic rest for 6 weeks.  Diet: routine diet Anticipated Birth Control: Unsure Postpartum Appointment:6 weeks Follow up Visit: [facility]   01/09/2024 Lavonia CHRISTELLA Guppy, MD   "

## 2024-01-11 LAB — SURGICAL PATHOLOGY

## 2024-01-12 ENCOUNTER — Encounter: Payer: Self-pay | Admitting: Pediatrics

## 2024-01-14 ENCOUNTER — Telehealth (HOSPITAL_COMMUNITY): Payer: Self-pay

## 2024-01-14 NOTE — Telephone Encounter (Signed)
 01/14/2024 1954  Name: ELIANY MCCARTER MRN: [id number] DOB: 12/01/1995  Reason for Call:  Transition of Care Hospital Discharge Call  Contact Status: Patient Contact Status: Complete  Language assistant needed:          Follow-Up Questions: Do You Have Any Concerns About Your Health As You Heal From Delivery?: No Do You Have Any Concerns About Your Infants Health?: No  Edinburgh Postnatal Depression Scale:  In the Past 7 Days: I have been able to laugh and see the funny side of things.: Not quite so much now I have looked forward with enjoyment to things.: As much as I ever did I have blamed myself unnecessarily when things went wrong.: Not very often I have been anxious or worried for no good reason.: Hardly ever I have felt scared or panicky for no good reason.: Yes, sometimes Things have been getting on top of me.: No, most of the time I have coped quite well I have been so unhappy that I have had difficulty sleeping.: Not very often I have felt sad or miserable.: No, not at all I have been so unhappy that I have been crying.: Only occasionally The thought of harming myself has occurred to me.: Never Edinburgh Postnatal Depression Scale Total: 8  PHQ2-9 Depression Scale:     Discharge Follow-up: Edinburgh score requires follow up?: No Patient was advised of the following resources:: Breastfeeding Support Group, Support Group  Post-discharge interventions: Reviewed Newborn Safe Sleep Practices  Signature  Rosaline Deretha PEAK

## 2024-01-19 ENCOUNTER — Other Ambulatory Visit: Payer: Self-pay

## 2024-01-19 ENCOUNTER — Inpatient Hospital Stay (HOSPITAL_COMMUNITY)
Admission: AD | Admit: 2024-01-19 | Discharge: 2024-01-19 | Disposition: A | Discharge status: Home / Self Care | Attending: Obstetrics and Gynecology | Admitting: Obstetrics and Gynecology

## 2024-01-19 ENCOUNTER — Encounter (HOSPITAL_COMMUNITY): Payer: Self-pay | Admitting: Obstetrics and Gynecology

## 2024-01-19 DIAGNOSIS — I10 Essential (primary) hypertension: Secondary | ICD-10-CM | POA: Diagnosis not present

## 2024-01-19 DIAGNOSIS — O165 Unspecified maternal hypertension, complicating the puerperium: Secondary | ICD-10-CM

## 2024-01-19 DIAGNOSIS — R519 Headache, unspecified: Secondary | ICD-10-CM

## 2024-01-19 LAB — CBC
HCT: 44.6 % (ref 36.0–46.0)
Hemoglobin: 14.7 g/dL (ref 12.0–15.0)
MCH: 28 pg (ref 26.0–34.0)
MCHC: 33 g/dL (ref 30.0–36.0)
MCV: 85 fL (ref 80.0–100.0)
Platelets: 157 K/uL (ref 150–400)
RBC: 5.25 MIL/uL — ABNORMAL HIGH (ref 3.87–5.11)
RDW: 12.8 % (ref 11.5–15.5)
WBC: 4.4 K/uL (ref 4.0–10.5)
nRBC: 0 % (ref 0.0–0.2)

## 2024-01-19 LAB — COMPREHENSIVE METABOLIC PANEL WITH GFR
ALT: 25 U/L (ref 0–44)
AST: 21 U/L (ref 15–41)
Albumin: 4.3 g/dL (ref 3.5–5.0)
Alkaline Phosphatase: 159 U/L — ABNORMAL HIGH (ref 38–126)
Anion gap: 13 (ref 5–15)
BUN: 14 mg/dL (ref 6–20)
CO2: 23 mmol/L (ref 22–32)
Calcium: 9.2 mg/dL (ref 8.9–10.3)
Chloride: 105 mmol/L (ref 98–111)
Creatinine, Ser: 0.91 mg/dL (ref 0.44–1.00)
GFR, Estimated: >60 mL/min
Glucose, Bld: 71 mg/dL (ref 70–99)
Potassium: 4 mmol/L (ref 3.5–5.1)
Sodium: 141 mmol/L (ref 135–145)
Total Bilirubin: 0.4 mg/dL (ref 0.0–1.2)
Total Protein: 7.1 g/dL (ref 6.5–8.1)

## 2024-01-19 MED ORDER — DEXAMETHASONE SODIUM PHOSPHATE 10 MG/ML IJ SOLN
10.0000 mg | Freq: Once | INTRAMUSCULAR | Status: AC
  Administered 2024-01-19: 10 mg via INTRAVENOUS
  Filled 2024-01-19: qty 1

## 2024-01-19 MED ORDER — IBUPROFEN 800 MG PO TABS
800.0000 mg | ORAL_TABLET | Freq: Once | ORAL | Status: AC
  Administered 2024-01-19: 800 mg via ORAL
  Filled 2024-01-19: qty 1

## 2024-01-19 MED ORDER — METOCLOPRAMIDE HCL 5 MG/ML IJ SOLN
10.0000 mg | Freq: Once | INTRAMUSCULAR | Status: AC
  Administered 2024-01-19: 10 mg via INTRAVENOUS
  Filled 2024-01-19: qty 2

## 2024-01-19 MED ORDER — NIFEDIPINE ER OSMOTIC RELEASE 30 MG PO TB24: 30.0000 mg | ORAL_TABLET | Freq: Every day | ORAL | 2 refills | Status: AC

## 2024-01-19 MED ORDER — LACTATED RINGERS IV BOLUS
500.0000 mL | Freq: Once | INTRAVENOUS | Status: AC
  Administered 2024-01-19: 500 mL via INTRAVENOUS

## 2024-01-19 MED ORDER — DIPHENHYDRAMINE HCL 50 MG/ML IJ SOLN
25.0000 mg | Freq: Once | INTRAMUSCULAR | Status: AC
  Administered 2024-01-19: 25 mg via INTRAVENOUS
  Filled 2024-01-19: qty 1

## 2024-01-19 NOTE — MAU Provider Note (Addendum)
 " History     CSN: [id number]  Arrival date and time: 01/19/24 1726   Event Date/Time   First Provider Initiated Contact with Patient 01/19/24 1811      Chief Complaint  Patient presents with   Headache   HPI Norma Morris is a 29 y.o. female at 12 days post partum who presents for headache & hypertension. 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 Has never been on medication. Reports no hypertension as adult nor during pregnancy. She had SVD on 1/1 after being induced for FGR.  Reports constant headache x 3 days. Has been takin excedrin tension & ibuprofen  without relief. Took excedrin tension at 1 pm today. Rates h/a 8/10. Worse with lights. Denies visual disturbance or epigastric pain. Took BP at home today & it was elevated.   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 60 tablet 1 01/19/2024   Prenatal MV & Min w/FA-DHA (PRENATAL ADULT GUMMY/DHA/FA) 0.4-25 MG CHEW as directed Orally   01/18/2024   albuterol  (VENTOLIN  HFA) 108 (90 Base) MCG/ACT inhaler Inhale 1-2 puffs into the lungs every 6 (six) hours as needed for wheezing or shortness of breath. 1 each 0 More than a month   albuterol  (VENTOLIN  HFA) 108 (90 Base) MCG/ACT inhaler Inhale 2 puffs  into the lungs every 6 (six) hours as needed for wheezing or shortness of breath. 8 g 0    Albuterol  Sulfate (PROAIR  RESPICLICK) 108 (90 Base) MCG/ACT AEPB 1 puff as needed Inhalation every 4 hrs      fluticasone  (FLONASE ) 50 MCG/ACT nasal spray Place 1 spray into both nostrils daily.       Review of Systems  All other systems reviewed and are negative.  Physical Exam   Blood pressure (!) 150/103, pulse 64, temperature 98.3 F (36.8 C), temperature source Oral, resp. rate 18, height 4' 11 (1.499 m), weight 66.7 kg, SpO2 99%, unknown if currently breastfeeding.  Physical Exam Vitals and nursing note reviewed.  Constitutional:      General: She is not in acute distress.    Appearance: She is well-developed. She is not ill-appearing.  HENT:     Head: Normocephalic and atraumatic.  Eyes:     General: No scleral icterus.       Right eye: No discharge.        Left eye: No discharge.  Conjunctiva/sclera: Conjunctivae normal.  Pulmonary:     Effort: Pulmonary effort is normal. No respiratory distress.  Neurological:     General: No focal deficit present.     Mental Status: She is alert.  Psychiatric:        Mood and Affect: Mood normal.        Behavior: Behavior normal.     MAU Course  Procedures Results for orders placed or performed during the hospital encounter of 01/19/24 (from the past 24 hours)  CBC     Status: Abnormal   Collection Time: 01/19/24  6:58 PM  Result Value Ref Range   WBC 4.4 4.0 - 10.5 K/uL   RBC 5.25 (H) 3.87 - 5.11 MIL/uL   Hemoglobin 14.7 12.0 - 15.0 g/dL   HCT 55.3 63.9 - 53.9 %   MCV 85.0 80.0 - 100.0 fL   MCH 28.0 26.0 - 34.0 pg   MCHC 33.0 30.0 - 36.0 g/dL   RDW 87.1 88.4 - 84.4 %   Platelets 157 150 - 400 K/uL   nRBC 0.0 0.0 - 0.2 %  Comprehensive metabolic panel with GFR     Status: Abnormal   Collection Time: 01/19/24  6:58 PM  Result Value Ref Range   Sodium 141 135 - 145 mmol/L   Potassium 4.0 3.5 - 5.1 mmol/L   Chloride 105 98 -  111 mmol/L   CO2 23 22 - 32 mmol/L   Glucose, Bld 71 70 - 99 mg/dL   BUN 14 6 - 20 mg/dL   Creatinine, Ser 9.08 0.44 - 1.00 mg/dL   Calcium 9.2 8.9 - 89.6 mg/dL   Total Protein 7.1 6.5 - 8.1 g/dL   Albumin 4.3 3.5 - 5.0 g/dL   AST 21 15 - 41 U/L   ALT 25 0 - 44 U/L   Alkaline Phosphatase 159 (H) 38 - 126 U/L   Total Bilirubin 0.4 0.0 - 1.2 mg/dL   GFR, Estimated >39 >39 mL/min   Anion gap 13 5 - 15    MDM -Postpartum patient presents with new onset hypertension. Mild range Bps, none severe range. Labs unremarkable.  -Headache initially treated with ibuprofen  with some improvement. IV headache cocktail ordered for next step in management per patient request.   Care turned over to Dr. Ilean Rocky S[place]field 01/19/2024 8:16 PM   Assessment and Plan   Addendum:  On reevaluation patient reports that headache cocktail helped immensely and her headache is greatly improved.  Blood pressure still mild range elevation.  Spoke with on-call provider for her practice who will have patient scheduled for blood pressure check in the next few days.  Someone should call her tomorrow to schedule this appointment.  Will start patient on Procardia  30 mg daily.  Discussed strict return precautions.  Patient discharged home.    [1]  Social History Tobacco Use   Smoking status: Never   Smokeless tobacco: Never  Vaping Use   Vaping status: Never Used  Substance Use Topics   Alcohol use: Not Currently    Comment: occ   Drug use: No  [2]  Allergies Allergen Reactions   Aspirin Nausea Only    Other Reaction(s): stomach upset   Doxycycline Nausea And Vomiting    Other Reaction(s): Not available, vomiting, nausea, stomach pain  doxycycline   Norgestimate-Eth Estradiol     Other Reaction(s): numbness on right side and headaches   Shellfish Allergy Itching    Other Reaction(s): facial swelling   Shellfish Protein-Containing Drug Products  Other Reaction(s): throat itching   Cyclobenzaprine   Rash, Dermatitis and Hives   "

## 2024-01-19 NOTE — MAU Note (Signed)
 G1P1 s/p SVD on 1/1 reporting H/A unresolved with Ibuprofen  and Excedrin today.   +3 reflexes, no clonus, no vision changes, no RUQ pain.

## 2024-01-19 NOTE — MAU Note (Addendum)
 Norma Morris is a 29 y.o. at Unknown here in MAU reporting: she's had a HA for the past 3 days that's not responding to Ibuprofen  or Excedrin since yesterday.  States HA inially was relieved with Ibuprofen .  Reports took BP BP's and  they were 143/90 and at 1317 155/105 at 1535.  Denies visual disturbances and RUQ pain.  S/P NVD 01/07/2024  LMP: NA Onset of complaint: 3 days Pain score: 8 Vitals:   01/19/24 1750  BP: (!) 156/104  Pulse: 62  Resp: 18  Temp: 98.3 F (36.8 C)  SpO2: 99%     FHT: NA  Lab orders placed from triage: None

## 2024-01-19 NOTE — Discharge Instructions (Signed)
 It was a pleasure taking care of you tonight.  I am glad that your headache is feeling better.  You have postpartum hypertension and I sent a medication to your pharmacy to help with your blood pressures.  Be sure to follow-up with your primary OB provider in the next few days, someone should be calling you tomorrow to schedule this appointment.  If your headache returns, you have any changes in vision, right upper quadrant pain in which you return for further evaluation.  I hope you have a great rest of your night!

## 2024-01-23 ENCOUNTER — Encounter (HOSPITAL_COMMUNITY): Payer: Self-pay

## 2024-01-23 ENCOUNTER — Other Ambulatory Visit: Payer: Self-pay

## 2024-01-23 ENCOUNTER — Inpatient Hospital Stay (HOSPITAL_COMMUNITY)
Admission: AD | Admit: 2024-01-23 | Discharge: 2024-01-23 | Disposition: A | Discharge status: Home / Self Care | Attending: Obstetrics & Gynecology | Admitting: Obstetrics & Gynecology

## 2024-01-23 DIAGNOSIS — R1013 Epigastric pain: Secondary | ICD-10-CM | POA: Insufficient documentation

## 2024-01-23 DIAGNOSIS — R1011 Right upper quadrant pain: Secondary | ICD-10-CM | POA: Insufficient documentation

## 2024-01-23 DIAGNOSIS — R519 Headache, unspecified: Secondary | ICD-10-CM | POA: Diagnosis not present

## 2024-01-23 DIAGNOSIS — O165 Unspecified maternal hypertension, complicating the puerperium: Secondary | ICD-10-CM | POA: Diagnosis not present

## 2024-01-23 LAB — CBC
HCT: 40.7 % (ref 36.0–46.0)
Hemoglobin: 13.4 g/dL (ref 12.0–15.0)
MCH: 28.2 pg (ref 26.0–34.0)
MCHC: 32.9 g/dL (ref 30.0–36.0)
MCV: 85.7 fL (ref 80.0–100.0)
Platelets: 169 K/uL (ref 150–400)
RBC: 4.75 MIL/uL (ref 3.87–5.11)
RDW: 12.7 % (ref 11.5–15.5)
WBC: 5.5 K/uL (ref 4.0–10.5)
nRBC: 0 % (ref 0.0–0.2)

## 2024-01-23 LAB — COMPREHENSIVE METABOLIC PANEL WITH GFR
ALT: 20 U/L (ref 0–44)
AST: 17 U/L (ref 15–41)
Albumin: 4.1 g/dL (ref 3.5–5.0)
Alkaline Phosphatase: 130 U/L — ABNORMAL HIGH (ref 38–126)
Anion gap: 10 (ref 5–15)
BUN: 15 mg/dL (ref 6–20)
CO2: 24 mmol/L (ref 22–32)
Calcium: 9.2 mg/dL (ref 8.9–10.3)
Chloride: 107 mmol/L (ref 98–111)
Creatinine, Ser: 0.98 mg/dL (ref 0.44–1.00)
GFR, Estimated: >60 mL/min
Glucose, Bld: 95 mg/dL (ref 70–99)
Potassium: 4.3 mmol/L (ref 3.5–5.1)
Sodium: 141 mmol/L (ref 135–145)
Total Bilirubin: 0.2 mg/dL (ref 0.0–1.2)
Total Protein: 6.4 g/dL — ABNORMAL LOW (ref 6.5–8.1)

## 2024-01-23 MED ORDER — FAMOTIDINE 20 MG PO TABS: 20.0000 mg | ORAL_TABLET | Freq: Two times a day (BID) | ORAL | 0 refills | Status: AC | PRN

## 2024-01-23 MED ORDER — LACTATED RINGERS IV BOLUS
1000.0000 mL | Freq: Once | INTRAVENOUS | Status: AC
  Administered 2024-01-23: 1000 mL via INTRAVENOUS

## 2024-01-23 MED ORDER — METOCLOPRAMIDE HCL 10 MG PO TABS: 10.0000 mg | ORAL_TABLET | Freq: Four times a day (QID) | ORAL | 0 refills | Status: AC | PRN

## 2024-01-23 MED ORDER — METOCLOPRAMIDE HCL 5 MG/ML IJ SOLN
10.0000 mg | Freq: Once | INTRAMUSCULAR | Status: AC
  Administered 2024-01-23: 10 mg via INTRAVENOUS
  Filled 2024-01-23: qty 2

## 2024-01-23 MED ORDER — ACETAMINOPHEN-CAFFEINE 500-65 MG PO TABS: 2.0000 | ORAL_TABLET | ORAL | 0 refills | Status: AC | PRN

## 2024-01-23 MED ORDER — FAMOTIDINE IN NACL 20-0.9 MG/50ML-% IV SOLN
20.0000 mg | Freq: Once | INTRAVENOUS | Status: AC
  Administered 2024-01-23: 20 mg via INTRAVENOUS
  Filled 2024-01-23: qty 50

## 2024-01-23 NOTE — MAU Note (Signed)
..  Norma Morris is a 29 y.o. at [facility] here in MAU reporting:  Elevated blood pressure at home of 140/80. Headache on and off since last MAU visit. Took 1,000 mg of tylenol  at 6pm and it helped some, now headache is a 4/10. Epigastric pain that began yesterday but intensified today, feels like a kick in the ribs, sharp and intermittent. 5/10 Denies visual changes.  Takes 30 mg of Procardia  twice a day next dose is at midnight.   Pain score: refer to note Vitals:   01/23/24 2022  BP: 124/82  Pulse: 85  Resp: 18  Temp: 98.4 F (36.9 C)  SpO2: 99%     FHT:n/a Lab orders placed from triage: none

## 2024-01-23 NOTE — MAU Provider Note (Signed)
 Chief Complaint:  Headache and Abdominal Pain   HPI   PP Patient  Norma Morris is a 29 y.o. G1P1001 PP NSVD 01/07/2024. Patient reports she has had a HA since last MAU visit 01/19/23 and she took her BP at home which was 140/80. She reported taking 1 GM Tylenol  at 6 PM and her HA has started to resolve, now 4/10 on pain scale. She is a patient of [facility] and had her Procardia  recently increased to 30 mg XL BID. She denies any visual changes, SOB, CP, SOB, N/V but reports mild epigastric pain.   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    I have reviewed patient's Past Medical Hx, Surgical Hx, Family Hx, Social Hx, medications and allergies.   ROS  Pertinent items noted in HPI and remainder of comprehensive ROS otherwise negative.   PHYSICAL EXAM  Patient Vitals for the past 24 hrs:  BP Temp Temp src Pulse Resp SpO2 Height Weight  01/23/24 2307 119/79 -- -- (!) 55 -- -- -- --  01/23/24 2045 137/80 -- -- 71 -- -- -- --  01/23/24 2022 124/82 98.4 F (36.9 C) Oral 85 18 99 % 4' 11 (1.499 m) 67.5 kg    Constitutional: Well-developed, obese female  in no acute distress.  Cardiovascular: normal rate & rhythm, warm and well-perfused Respiratory: normal effort, no problems with respiration noted, Lungs BCTA GI: Abd soft,  non-tender, no ruw pain illicited on light palpation MS: Extremities nontender, no edema, normal ROM Neurologic: Alert and oriented x 4.  GU: no CVA tenderness      Labs: Results for orders placed or performed during the hospital encounter of 01/23/24 (from the past 24 hours)  CBC     Status: None   Collection Time: 01/23/24  9:26 PM  Result Value Ref Range   WBC 5.5 4.0 - 10.5 K/uL   RBC 4.75 3.87 - 5.11 MIL/uL   Hemoglobin 13.4 12.0 - 15.0 g/dL   HCT 59.2 63.9 - 53.9 %   MCV 85.7 80.0 - 100.0 fL   MCH 28.2 26.0 - 34.0 pg   MCHC 32.9 30.0 - 36.0 g/dL   RDW 87.2 88.4 - 84.4 %   Platelets 169 150 - 400 K/uL   nRBC 0.0 0.0 - 0.2 %  Comprehensive metabolic panel     Status: Abnormal   Collection Time: 01/23/24  9:26 PM  Result Value Ref Range   Sodium 141 135 - 145 mmol/L   Potassium 4.3 3.5 - 5.1 mmol/L   Chloride 107 98 - 111 mmol/L   CO2 24 22 - 32 mmol/L   Glucose, Bld 95 70 -  99 mg/dL   BUN 15 6 - 20 mg/dL   Creatinine, Ser 9.01 0.44 - 1.00 mg/dL   Calcium 9.2 8.9 - 89.6 mg/dL   Total Protein 6.4 (L) 6.5 - 8.1 g/dL   Albumin 4.1 3.5 - 5.0 g/dL   AST 17 15 - 41 U/L   ALT 20 0 - 44 U/L   Alkaline Phosphatase 130 (H) 38 - 126 U/L   Total Bilirubin 0.2 0.0 - 1.2 mg/dL   GFR, Estimated >39 >39 mL/min   Anion gap 10 5 - 15     MDM & MAU COURSE  MDM:  HIGH  CBC CMP IVLR Bolus for HA Reglan  /Pepcid  IVP for abdominal discomfort and HA Cycle BP's (Normotensive) Labs WNL BP's normotensive Patient reassessed after medication and reports resolve with HA/Abdominal pain Will plan to discharge and f/u with primary OB 01/26/23. HA's may be caused due to Side effect  of Procardia  ( Patient will address with primary OB)  Continue current antihypertensive medication    MAU Course: Orders Placed This Encounter  Procedures   CBC   Comprehensive metabolic panel   Discharge patient Discharge disposition: 01-Home or Self Care; Discharge patient date: 01/23/2024   Meds  ordered this encounter  Medications   lactated ringers  bolus 1,000 mL   metoCLOPramide  (REGLAN ) injection 10 mg   famotidine  (PEPCID ) IVPB 20 mg premix   metoCLOPramide  (REGLAN ) 10 MG tablet    Sig: Take 1 tablet (10 mg total) by mouth every 6 (six) hours as needed for nausea.    Dispense:  30 tablet    Refill:  0    Supervising Provider:   PRATT, TANYA S [[id number]]   famotidine  (PEPCID ) 20 MG tablet    Sig: Take 1 tablet (20 mg total) by mouth 2 (two) times daily as needed for heartburn or indigestion.    Dispense:  60 tablet    Refill:  0    Supervising Provider:   PRATT, TANYA S [[id number]]   acetaminophen -caffeine  (EXCEDRIN TENSION HEADACHE) 500-65 MG TABS per tablet    Sig: Take 2 tablets by mouth as needed (For headaches).    Dispense:  60 tablet    Refill:  0    Supervising Provider:   PRATT, TANYA S [[id number]]     ASSESSMENT   1. Acute nonintractable headache, unspecified headache type   2. Postpartum state   3. Hypertension, postpartum condition or complication   4. RUQ pain     PLAN   Acute nonintractable headache, unspecified headache type Received IVF, Reglan  IVP for HA On Procardia  30 mg XL BID  Patient to address medication with primary OB due to increase in HA's with adjusted dosing of Procardia    Postpartum state  Hypertension, postpartum condition or complication Well controlled on current antihypertensive medication No evidence of PP PreE at this time   RUQ pain Resolved with IVP Reglan  and Pepcid  Labs /LFT's NM     Discharge home in stable condition with return precautions.   See AVS for full description of information given to the patient including both verbal and written. Patient verbalized understanding and agrees with the plan as described above.    Follow-up Information     [facility] Follow up.   Why: If symptoms worsen or fail to resolve, As scheduled for postpartum follow-up Contact information: [address] [place] [address] [phone]  Allergies as of 01/23/2024       Reactions   Aspirin Nausea Only   Other Reaction(s): stomach upset   Doxycycline Nausea And Vomiting   Other Reaction(s): Not available, vomiting, nausea, stomach pain doxycycline   Norgestimate-eth Estradiol    Other Reaction(s): numbness on right side and headaches   Shellfish Allergy Itching   Other Reaction(s): facial swelling   Shellfish Protein-containing Drug Products    Other Reaction(s): throat itching   Cyclobenzaprine  Rash, Dermatitis, Hives        Medication List     TAKE these medications    acetaminophen -caffeine  500-65 MG Tabs per tablet Commonly known as: EXCEDRIN TENSION HEADACHE Take 2 tablets by mouth as needed (For headaches).   Albuterol  Sulfate 108 (90 Base) MCG/ACT Aepb Commonly known as: PROAIR  RESPICLICK 1 puff as needed Inhalation every 4 hrs   albuterol  108 (90 Base) MCG/ACT inhaler Commonly known as: VENTOLIN  HFA Inhale 1-2 puffs into the lungs every 6 (six) hours as needed for wheezing or shortness of breath.   albuterol  108 (90 Base) MCG/ACT inhaler Commonly known as: VENTOLIN  HFA Inhale 2 puffs into the lungs every 6 (six) hours as needed for wheezing or shortness of breath.   famotidine  20 MG tablet Commonly known as: Pepcid  Take 1 tablet (20 mg total) by mouth 2 (two) times daily as needed for heartburn or indigestion.   fluticasone  50 MCG/ACT nasal spray Commonly known as: FLONASE  Place 1 spray into both nostrils daily.   ibuprofen  800 MG tablet Commonly known as: ADVIL  Take 1 tablet (800 mg total) by mouth every 8 (eight) hours as needed.   metoCLOPramide  10 MG tablet Commonly known as: REGLAN  Take 1 tablet (10 mg total) by mouth every 6 (six) hours as needed for nausea.   NIFEdipine  30 MG 24 hr tablet Commonly known as: PROCARDIA -XL/NIFEDICAL-XL Take 1 tablet (30 mg total) by mouth daily. What changed: when to take this    Prenatal Adult Gummy/DHA/FA 0.4-25 MG Chew as directed Orally        Lala Been, MSN, WHNP-BC [facility] [employer]       [1]  Social History Tobacco Use   Smoking status: Never   Smokeless tobacco: Never  Vaping Use   Vaping status: Never Used  Substance Use Topics   Alcohol use: Not Currently    Comment: occ   Drug use: No  [2]  Allergies Allergen Reactions   Aspirin Nausea Only    Other Reaction(s): stomach upset   Doxycycline Nausea And Vomiting    Other Reaction(s): Not available, vomiting, nausea, stomach pain  doxycycline   Norgestimate-Eth Estradiol     Other Reaction(s): numbness on right side and headaches   Shellfish Allergy Itching    Other Reaction(s): facial swelling   Shellfish Protein-Containing Drug Products     Other Reaction(s): throat itching   Cyclobenzaprine  Rash, Dermatitis and Hives

## 2024-01-23 NOTE — Discharge Instructions (Signed)
 You are evaluated today in the MAU for abdominal pain in the right upper quadrant and a headache.  I would reach out to your physician on Tuesday regarding the blood pressure medications that you are currently taking since one of the side effects is headaches.    Please continue your  current blood pressure medication until you have talked with your physician.  It was a pleasure taking care of you today. Please let us  know if we can be of further assistance
